# Patient Record
Sex: Female | Born: 1965
Health system: Southern US, Community
[De-identification: ages and names within clinical notes are randomized; demographics above are authoritative.]

---

## 1999-05-19 ENCOUNTER — Ambulatory Visit (HOSPITAL_COMMUNITY): Admission: RE | Admit: 1999-05-19 | Discharge: 1999-05-19 | Payer: Self-pay | Admitting: Gastroenterology

## 2000-04-12 ENCOUNTER — Ambulatory Visit (HOSPITAL_COMMUNITY): Admission: RE | Admit: 2000-04-12 | Discharge: 2000-04-12 | Payer: Self-pay | Admitting: Gastroenterology

## 2001-06-06 ENCOUNTER — Other Ambulatory Visit: Admission: RE | Admit: 2001-06-06 | Discharge: 2001-06-06 | Payer: Self-pay | Admitting: Obstetrics & Gynecology

## 2001-08-01 ENCOUNTER — Ambulatory Visit (HOSPITAL_COMMUNITY): Admission: RE | Admit: 2001-08-01 | Discharge: 2001-08-01 | Payer: Self-pay | Admitting: Obstetrics & Gynecology

## 2001-08-01 ENCOUNTER — Encounter: Payer: Self-pay | Admitting: Obstetrics & Gynecology

## 2001-12-27 ENCOUNTER — Inpatient Hospital Stay (HOSPITAL_COMMUNITY): Admission: AD | Admit: 2001-12-27 | Discharge: 2001-12-29 | Payer: Self-pay | Admitting: Obstetrics & Gynecology

## 2005-05-11 ENCOUNTER — Ambulatory Visit: Payer: Self-pay

## 2007-01-03 ENCOUNTER — Ambulatory Visit: Payer: Self-pay | Admitting: Family Medicine

## 2007-10-24 ENCOUNTER — Ambulatory Visit: Payer: Self-pay | Admitting: Internal Medicine

## 2009-03-18 ENCOUNTER — Ambulatory Visit: Payer: Self-pay | Admitting: Internal Medicine

## 2009-04-29 ENCOUNTER — Ambulatory Visit: Payer: Self-pay | Admitting: Internal Medicine

## 2010-01-07 ENCOUNTER — Ambulatory Visit: Payer: Self-pay | Admitting: Internal Medicine

## 2010-08-12 ENCOUNTER — Ambulatory Visit: Payer: Self-pay | Admitting: Internal Medicine

## 2010-09-11 ENCOUNTER — Ambulatory Visit: Payer: Self-pay | Admitting: Internal Medicine

## 2010-09-13 ENCOUNTER — Ambulatory Visit: Payer: Self-pay | Admitting: Internal Medicine

## 2010-09-21 ENCOUNTER — Ambulatory Visit: Payer: Self-pay | Admitting: Internal Medicine

## 2010-10-12 ENCOUNTER — Ambulatory Visit: Payer: Self-pay | Admitting: Internal Medicine

## 2013-09-25 ENCOUNTER — Ambulatory Visit: Payer: Self-pay

## 2016-11-28 DIAGNOSIS — L659 Nonscarring hair loss, unspecified: Secondary | ICD-10-CM | POA: Diagnosis not present

## 2016-11-28 DIAGNOSIS — L710 Perioral dermatitis: Secondary | ICD-10-CM | POA: Diagnosis not present

## 2016-11-28 DIAGNOSIS — L638 Other alopecia areata: Secondary | ICD-10-CM | POA: Diagnosis not present

## 2016-12-14 DIAGNOSIS — L710 Perioral dermatitis: Secondary | ICD-10-CM | POA: Diagnosis not present

## 2017-04-11 ENCOUNTER — Other Ambulatory Visit: Payer: Self-pay | Admitting: Nurse Practitioner

## 2017-04-11 DIAGNOSIS — Z1231 Encounter for screening mammogram for malignant neoplasm of breast: Secondary | ICD-10-CM

## 2017-08-22 DIAGNOSIS — L718 Other rosacea: Secondary | ICD-10-CM | POA: Diagnosis not present

## 2017-09-14 DIAGNOSIS — L718 Other rosacea: Secondary | ICD-10-CM | POA: Diagnosis not present

## 2017-11-14 DIAGNOSIS — N390 Urinary tract infection, site not specified: Secondary | ICD-10-CM | POA: Diagnosis not present

## 2017-11-15 DIAGNOSIS — E782 Mixed hyperlipidemia: Secondary | ICD-10-CM | POA: Diagnosis not present

## 2017-11-15 DIAGNOSIS — Z0001 Encounter for general adult medical examination with abnormal findings: Secondary | ICD-10-CM | POA: Diagnosis not present

## 2017-11-23 ENCOUNTER — Other Ambulatory Visit: Payer: Self-pay

## 2017-11-23 MED ORDER — ROSUVASTATIN CALCIUM 10 MG PO TABS: 10.0000 mg | ORAL_TABLET | Freq: Every day | ORAL | 3 refills | Status: DC

## 2017-12-19 ENCOUNTER — Other Ambulatory Visit: Payer: Self-pay | Admitting: Internal Medicine

## 2017-12-20 ENCOUNTER — Other Ambulatory Visit: Payer: Self-pay

## 2017-12-20 ENCOUNTER — Telehealth: Payer: Self-pay | Admitting: Cardiovascular Disease

## 2017-12-20 DIAGNOSIS — Z136 Encounter for screening for cardiovascular disorders: Secondary | ICD-10-CM

## 2017-12-20 MED ORDER — ESTRADIOL 0.025 MG/24HR TD PTWK: 0.0250 mg | MEDICATED_PATCH | TRANSDERMAL | 1 refills | Status: DC

## 2017-12-20 MED ORDER — PROGESTERONE MICRONIZED 100 MG PO CAPS: 100.0000 mg | ORAL_CAPSULE | Freq: Every day | ORAL | 3 refills | Status: DC

## 2017-12-20 NOTE — Telephone Encounter (Signed)
Scheduled CT calcium score January 17, 9am. No answer, VM full on pt's cell number.  Called office number. Office is closed.  Will call again.

## 2017-12-20 NOTE — Telephone Encounter (Signed)
Pt understands the test is $150 payable at the time of appt. She is agreeable with January 17, 8:45am arrival. Provided address and phone number to [facility] [place].

## 2017-12-20 NOTE — Telephone Encounter (Signed)
Iran OuchArida, Muhammad A, MD  Shon BatonYow, Tikia Skilton H, RN        Can you please schedule Dr. Welton FlakesKhan for coronary calcium score ( cell # [phone]) . Thanks.    Left message for CT cardiac scoring to call the office.

## 2017-12-28 ENCOUNTER — Encounter: Payer: Self-pay | Admitting: Radiology

## 2017-12-28 ENCOUNTER — Ambulatory Visit (INDEPENDENT_AMBULATORY_CARE_PROVIDER_SITE_OTHER)
Admission: RE | Admit: 2017-12-28 | Discharge: 2017-12-28 | Disposition: A | Discharge status: Home / Self Care | Payer: Self-pay | Source: Ambulatory Visit | Attending: Cardiovascular Disease | Admitting: Cardiovascular Disease

## 2017-12-28 DIAGNOSIS — Z136 Encounter for screening for cardiovascular disorders: Secondary | ICD-10-CM

## 2018-02-05 ENCOUNTER — Other Ambulatory Visit: Payer: Self-pay | Admitting: Nurse Practitioner

## 2018-02-05 DIAGNOSIS — L819 Disorder of pigmentation, unspecified: Secondary | ICD-10-CM

## 2018-02-05 MED ORDER — TRETINOIN 0.025 % EX CREA: TOPICAL_CREAM | Freq: Every day | CUTANEOUS | 3 refills | Status: DC

## 2018-02-05 NOTE — Progress Notes (Signed)
tertinoin 0.025% cream to be applied at bedtime as needed. [facility]

## 2018-02-12 ENCOUNTER — Ambulatory Visit: Payer: BLUE CROSS/BLUE SHIELD

## 2018-03-14 ENCOUNTER — Other Ambulatory Visit: Payer: Self-pay | Admitting: Nurse Practitioner

## 2018-03-14 DIAGNOSIS — K649 Unspecified hemorrhoids: Secondary | ICD-10-CM

## 2018-03-14 MED ORDER — LIDOCAINE (ANORECTAL) 5 % EX CREA: TOPICAL_CREAM | CUTANEOUS | 3 refills | Status: DC

## 2018-03-14 NOTE — Progress Notes (Signed)
Sent lidocaine (Anorectal) cream - apply 2 ot 3 times daily as needed.

## 2018-08-09 ENCOUNTER — Other Ambulatory Visit: Payer: Self-pay | Admitting: Nurse Practitioner

## 2018-08-09 MED ORDER — ESTRADIOL 0.025 MG/24HR TD PTTW: MEDICATED_PATCH | TRANSDERMAL | 1 refills | Status: DC

## 2018-08-15 ENCOUNTER — Other Ambulatory Visit: Payer: Self-pay | Admitting: Nurse Practitioner

## 2018-08-15 DIAGNOSIS — Z1239 Encounter for other screening for malignant neoplasm of breast: Secondary | ICD-10-CM

## 2018-08-15 NOTE — Progress Notes (Signed)
Order for screening mammogram placed today. Patient prefers [facility].

## 2018-09-05 ENCOUNTER — Ambulatory Visit
Admission: RE | Admit: 2018-09-05 | Discharge: 2018-09-05 | Disposition: A | Discharge status: Home / Self Care | Payer: BLUE CROSS/BLUE SHIELD | Source: Ambulatory Visit | Attending: Nurse Practitioner | Admitting: Nurse Practitioner

## 2018-09-05 DIAGNOSIS — Z1231 Encounter for screening mammogram for malignant neoplasm of breast: Secondary | ICD-10-CM | POA: Diagnosis not present

## 2018-09-05 DIAGNOSIS — Z1239 Encounter for other screening for malignant neoplasm of breast: Secondary | ICD-10-CM

## 2018-10-11 ENCOUNTER — Ambulatory Visit (INDEPENDENT_AMBULATORY_CARE_PROVIDER_SITE_OTHER): Payer: BLUE CROSS/BLUE SHIELD | Admitting: Internal Medicine

## 2018-10-11 ENCOUNTER — Encounter: Payer: Self-pay | Admitting: Internal Medicine

## 2018-10-11 DIAGNOSIS — N951 Menopausal and female climacteric states: Secondary | ICD-10-CM

## 2018-10-11 DIAGNOSIS — E785 Hyperlipidemia, unspecified: Secondary | ICD-10-CM | POA: Diagnosis not present

## 2018-10-11 DIAGNOSIS — F5102 Adjustment insomnia: Secondary | ICD-10-CM

## 2018-10-11 NOTE — Progress Notes (Signed)
Subjective:  Patient ID: Ashley Castillo, female    DOB: 1966-08-17  Age: 52 y.o. MRN: [id number]  CC: Diagnoses of Insomnia due to stress, Menopause syndrome, and Hyperlipidemia LDL goal <160 were pertinent to this visit.  HPI Ashley Castillo presents for establishment of care.  Ashley is generally healthy and has no acute issues.  Menopause in 2015.  Taking estrogen using a low dose patch oce a week. Using progesterone  50 mg compounded .  Has not had a period since menopause .   Cholesterol rose to 300 after menopause.  Statin intolerant due to severe myalgias.  Exercises regularly, had a coronary CT with Ashley Castillo,  Calcium score is zero.  Wondering about a newer statin for asians (starts with an "l)   Occasional insomnia managed with ambien prn  3 kids now all at home 2 college graduates and on 25 yr old.  Husband Ashley Castillo had cervical spine surgery at [place] did really well   Ashley Castillo.      History Ashley Castillo has no past medical history on file.   Ashley has no past surgical history on file.   Her family history includes Diabetes type I in her mother.Ashley Castillo. Ashley has never used smokeless tobacco. Ashley reports that Ashley does not drink alcohol or use drugs.  Outpatient Medications Prior to Visit  Medication Sig Dispense Refill  . estradiol (VIVELLE-DOT) 0.025 MG/24HR APPLY 1 PATCH TWICE A WEEK 24 patch 1  . Lidocaine, Anorectal, 5 % CREA Apply externally 2 to 3 times daily as needed 15 g 3  . rosuvastatin (CRESTOR) 10 MG tablet Take 1 tablet (10 mg total) by mouth daily. 90 tablet 3  . tretinoin (RETIN-A) 0.025 % cream Apply topically at bedtime. 45 g 3  . estradiol (CLIMARA - DOSED IN MG/24 HR) 0.025 mg/24hr patch Place 1 patch (0.025 mg total) onto the skin 2 (two) times a week. 1 patch twice weekly (Patient not taking: Reported on 10/11/2018) 24 patch 1  . progesterone (PROMETRIUM) 100 MG capsule Take 1 capsule (100 mg total) by mouth daily. (Patient not taking: Reported  on 10/11/2018) 90 capsule 3   No facility-administered medications prior to visit.     Review of Systems:  Patient denies headache, fevers, malaise, unintentional weight loss, skin rash, eye pain, sinus congestion and sinus pain, sore throat, dysphagia,  hemoptysis , cough, dyspnea, wheezing, chest pain, palpitations, orthopnea, edema, abdominal pain, nausea, melena, diarrhea, constipation, flank pain, dysuria, hematuria, urinary  Frequency, nocturia, numbness, tingling, seizures,  Focal weakness, Loss of consciousness,  Tremor, insomnia, depression, anxiety, and suicidal ideation.     Objective:  BP 106/80 (BP Location: Right Arm, Patient Position: Sitting, Cuff Size: Normal)   Pulse 73   Temp 97.9 F (36.6 C) (Oral)   Resp 15   Ht 5' 3.5" (1.613 m)   Wt 147 lb 12.8 oz (67 kg)   SpO2 98%   BMI 25.77 kg/m   Physical Exam:  General appearance: alert, cooperative and appears stated age Head: Normocephalic, without obvious abnormality, atraumatic Eyes: conjunctivae/corneas clear. PERRL, EOM's intact. Fundi benign. Ears: normal TM's and external ear canals both ears Nose: Nares normal. Septum midline. Mucosa normal. No drainage or sinus tenderness. Throat: lips, mucosa, and tongue normal; teeth and gums normal Neck: no adenopathy, no carotid bruit, no JVD, supple, symmetrical, trachea midline and thyroid not enlarged, symmetric, no tenderness/mass/nodules Lungs: clear to auscultation bilaterally Breasts: normal appearance, no masses or tenderness Heart: regular rate  and rhythm, S1, S2 normal, no murmur, click, rub or gallop Abdomen: soft, non-tender; bowel sounds normal; no masses,  no organomegaly Extremities: extremities normal, atraumatic, no cyanosis or edema Pulses: 2+ and symmetric Skin: Skin color, texture, turgor normal. No rashes or lesions Neurologic: Alert and oriented X 3, normal strength and tone. Normal symmetric reflexes. Normal coordination and gait.       Assessment & Plan:   Problem List Items Addressed This Visit    Hyperlipidemia LDL goal <160    Untreated due to statin intolerance.  Cardiac CT was done recently and her score was zero.  Ashley remains concerned about her risk and would consider another statin trial       Insomnia due to stress    Managed with prn ambien.  The risks and benefits of  Hypnotic  use were discussed with patient today including excessive sedation leading to respiratory depression,  impaired thinking/driving, and addiction.  Patient was advised to avoid concurrent use with alcohol, to use medication only as needed and not to share with others  .       Menopause syndrome    Managed with HRT.  Ashley is taking estrogen transdermal and progesterone       A total of 45 minutes was spent with patient more than half of which was spent in counseling patient on the above mentioned issues , reviewing and explaining recent labs and imaging studies done, and coordination of care.  I have discontinued Ashley Castillo's progesterone. I am also having her maintain her rosuvastatin, tretinoin, Lidocaine (Anorectal), and estradiol.  No orders of the defined types were placed in this encounter.   Medications Discontinued During This Encounter  Medication Reason  . progesterone (PROMETRIUM) 100 MG capsule Patient has not taken in last 30 days  . estradiol (CLIMARA - DOSED IN MG/24 HR) 0.025 mg/24hr patch     Follow-up: Return in about 3 months (around 01/11/2019) for CPE with PAP .   Ashley Shams, MD

## 2018-10-11 NOTE — Patient Instructions (Signed)
Glad to see you!  Phone [phone]

## 2018-10-13 DIAGNOSIS — F5102 Adjustment insomnia: Secondary | ICD-10-CM | POA: Insufficient documentation

## 2018-10-13 DIAGNOSIS — N951 Menopausal and female climacteric states: Secondary | ICD-10-CM | POA: Insufficient documentation

## 2018-10-13 DIAGNOSIS — E785 Hyperlipidemia, unspecified: Secondary | ICD-10-CM | POA: Insufficient documentation

## 2018-10-13 NOTE — Assessment & Plan Note (Signed)
Untreated due to statin intolerance.  Cardiac CT was done recently and her score was zero.  She remains concerned about her risk and would consider another statin trial

## 2018-10-13 NOTE — Assessment & Plan Note (Signed)
Managed with prn ambien.  The risks and benefits of  Hypnotic  use were discussed with patient today including excessive sedation leading to respiratory depression,  impaired thinking/driving, and addiction.  Patient was advised to avoid concurrent use with alcohol, to use medication only as needed and not to share with others  .

## 2018-10-13 NOTE — Assessment & Plan Note (Signed)
Managed with HRT.  She is taking estrogen transdermal and progesterone

## 2018-10-26 ENCOUNTER — Other Ambulatory Visit: Payer: Self-pay | Admitting: Nurse Practitioner

## 2018-10-26 DIAGNOSIS — N952 Postmenopausal atrophic vaginitis: Secondary | ICD-10-CM

## 2018-10-26 MED ORDER — ESTRADIOL 10 MCG VA TABS: 1.0000 | ORAL_TABLET | VAGINAL | 11 refills | Status: DC

## 2018-10-26 NOTE — Progress Notes (Signed)
Sent in prescription for vagifem tablets. Use 1 tablet vaginally twice weekly. New prescription sent to her pharmacy

## 2018-12-07 ENCOUNTER — Telehealth: Payer: Self-pay

## 2018-12-07 NOTE — Telephone Encounter (Signed)
Called in [facility]xycycline [facility]

## 2019-01-04 ENCOUNTER — Other Ambulatory Visit: Payer: Self-pay | Admitting: Nurse Practitioner

## 2019-01-04 DIAGNOSIS — N951 Menopausal and female climacteric states: Secondary | ICD-10-CM

## 2019-01-04 MED ORDER — ESTRADIOL 0.0375 MG/24HR TD PTTW: 1.0000 | MEDICATED_PATCH | TRANSDERMAL | 12 refills | Status: DC

## 2019-01-04 NOTE — Progress Notes (Signed)
Increased dose of vivelle dot to 0.0375mg /24hours and sent [facility].

## 2019-01-09 DIAGNOSIS — H0289 Other specified disorders of eyelid: Secondary | ICD-10-CM | POA: Diagnosis not present

## 2019-01-09 DIAGNOSIS — H04123 Dry eye syndrome of bilateral lacrimal glands: Secondary | ICD-10-CM | POA: Diagnosis not present

## 2019-02-18 ENCOUNTER — Telehealth: Payer: Self-pay

## 2019-02-18 NOTE — Telephone Encounter (Signed)
Called in tamiflu 75 mg for 5 days error other note

## 2019-02-18 NOTE — Telephone Encounter (Signed)
Called in tamiflu to [facility] for 10 days as per [employer]

## 2019-02-25 ENCOUNTER — Other Ambulatory Visit: Payer: Self-pay

## 2019-02-26 DIAGNOSIS — L638 Other alopecia areata: Secondary | ICD-10-CM | POA: Diagnosis not present

## 2019-02-27 ENCOUNTER — Other Ambulatory Visit: Payer: Self-pay | Admitting: Nurse Practitioner

## 2019-02-27 DIAGNOSIS — H1013 Acute atopic conjunctivitis, bilateral: Secondary | ICD-10-CM

## 2019-02-27 MED ORDER — KETOROLAC TROMETHAMINE 0.5 % OP SOLN: 1.0000 [drp] | Freq: Four times a day (QID) | OPHTHALMIC | 3 refills | Status: DC

## 2019-02-27 NOTE — Progress Notes (Signed)
acular eye drops for allergic conjunctivitis. Use 1 drop in both eyes QID prn sent to [facility].

## 2019-03-14 ENCOUNTER — Other Ambulatory Visit: Payer: Self-pay

## 2019-03-14 ENCOUNTER — Ambulatory Visit: Payer: BLUE CROSS/BLUE SHIELD | Admitting: Internal Medicine

## 2019-03-18 ENCOUNTER — Telehealth: Payer: BLUE CROSS/BLUE SHIELD | Admitting: Nurse Practitioner

## 2019-03-18 ENCOUNTER — Other Ambulatory Visit: Payer: Self-pay

## 2019-03-18 MED ORDER — ESTRADIOL 0.0375 MG/24HR TD PTTW: 1.0000 | MEDICATED_PATCH | TRANSDERMAL | 3 refills | Status: DC

## 2019-03-18 NOTE — Progress Notes (Deleted)
Done

## 2019-04-11 ENCOUNTER — Ambulatory Visit: Payer: BLUE CROSS/BLUE SHIELD | Admitting: Internal Medicine

## 2019-04-24 ENCOUNTER — Other Ambulatory Visit: Payer: Self-pay | Admitting: Nurse Practitioner

## 2019-04-24 ENCOUNTER — Other Ambulatory Visit: Payer: Self-pay

## 2019-04-24 DIAGNOSIS — L719 Rosacea, unspecified: Secondary | ICD-10-CM

## 2019-04-24 MED ORDER — IVERMECTIN 1 % EX CREA: 1.0000 "application " | TOPICAL_CREAM | Freq: Every day | CUTANEOUS | 5 refills | Status: DC

## 2019-04-24 MED ORDER — ESOMEPRAZOLE MAGNESIUM 40 MG PO CPDR: 40.0000 mg | DELAYED_RELEASE_CAPSULE | Freq: Every day | ORAL | 3 refills | Status: DC

## 2019-04-24 MED ORDER — PLECANATIDE 3 MG PO TABS: 1.0000 | ORAL_TABLET | Freq: Every day | ORAL | 3 refills | Status: DC

## 2019-04-24 NOTE — Progress Notes (Signed)
New prescription for soolantra sent to [facility].

## 2019-04-29 ENCOUNTER — Other Ambulatory Visit: Payer: Self-pay

## 2019-04-29 MED ORDER — ESTRADIOL 0.025 MG/24HR TD PTTW: 1.0000 | MEDICATED_PATCH | TRANSDERMAL | 3 refills | Status: DC

## 2019-04-30 ENCOUNTER — Ambulatory Visit: Payer: BLUE CROSS/BLUE SHIELD | Admitting: Adult Health

## 2019-04-30 DIAGNOSIS — Z9229 Personal history of other drug therapy: Secondary | ICD-10-CM

## 2019-05-13 NOTE — Progress Notes (Signed)
[facility] [address] [place], [place] [id number]  Internal MEDICINE  Telephone Visit  Patient Name: Ashley Castillo  [id number]  [id number]  Date of Service: 05/13/2019  I connected with the patient at 8:00am by telephone and verified the patients identity using two identifiers.   I discussed the limitations, risks, security and privacy concerns of performing an evaluation and management service by telephone and the availability of in person appointments. I also discussed with the patient that there may be a patient responsible charge related to the service.  The patient expressed understanding and agrees to proceed.    No chief complaint on file.   HPI     Current Medication: Outpatient Encounter Medications as of 03/18/2019  Medication Sig  . Estradiol 10 MCG TABS vaginal tablet Place 1 tablet (10 mcg total) vaginally 2 (two) times a week.  Marland Kitchen. ketorolac (ACULAR) 0.5 % ophthalmic solution Place 1 drop into both eyes 4 (four) times daily.  . Lidocaine, Anorectal, 5 % CREA Apply externally 2 to 3 times daily as needed  . rosuvastatin (CRESTOR) 10 MG tablet Take 1 tablet (10 mg total) by mouth daily.  Marland Kitchen. tretinoin (RETIN-A) 0.025 % cream Apply topically at bedtime.  . [DISCONTINUED] estradiol (VIVELLE-DOT) 0.0375 MG/24HR Place 1 patch onto the skin 2 (two) times a week.  . [DISCONTINUED] estradiol (VIVELLE-DOT) 0.0375 MG/24HR Place 1 patch onto the skin 2 (two) times a week.   No facility-administered encounter medications on file as of 03/18/2019.     Surgical History: No past surgical history on file.  Medical History: No past medical history on file.  Family History: Family History  Problem Relation Age of Onset  . Diabetes type I Mother     Social History   Socioeconomic History  . Marital status: Married    Spouse name: Not on file  . Number of children: Not on file  . Years of education: Not on file  . Highest education level: Not on file  Occupational  History  . Not on file  Social Needs  . Financial resource strain: Not on file  . Food insecurity:    Worry: Not on file    Inability: Not on file  . Transportation needs:    Medical: Not on file    Non-medical: Not on file  Tobacco Use  . Smoking status: Never Smoker  . Smokeless tobacco: Never Used  Substance and Sexual Activity  . Alcohol use: Never    Frequency: Never  . Drug use: Never  . Sexual activity: Yes    Birth control/protection: Post-menopausal  Lifestyle  . Physical activity:    Days per week: Not on file    Minutes per session: Not on file  . Stress: Not on file  Relationships  . Social connections:    Talks on phone: Not on file    Gets together: Not on file    Attends religious service: Not on file    Active member of club or organization: Not on file    Attends meetings of clubs or organizations: Not on file    Relationship status: Not on file  . Intimate partner violence:    Fear of current or ex partner: Not on file    Emotionally abused: Not on file    Physically abused: Not on file    Forced sexual activity: Not on file  Other Topics Concern  . Not on file  Social History Narrative  . Not on file  Review of Systems  Vital Signs: There were no vitals taken for this visit.   Observation/Objective:     Assessment/Plan:   General Counseling: Ashley Castillo verbalizes understanding of the findings of today's phone visit and agrees with plan of treatment. I have discussed any further diagnostic evaluation that may be needed or ordered today. We also reviewed her medications today. she has been encouraged to call the office with any questions or concerns that should arise related to todays visit.    No orders of the defined types were placed in this encounter.   Meds ordered this encounter  Medications  . DISCONTD: estradiol (VIVELLE-DOT) 0.0375 MG/24HR    Sig: Place 1 patch onto the skin 2 (two) times a week.    Dispense:  24 patch     Refill:  3    Time spent: 5 Minutes    Dr Lyndon Code Internal medicine

## 2019-05-30 ENCOUNTER — Other Ambulatory Visit: Payer: Self-pay

## 2019-05-30 MED ORDER — DICLOFENAC SODIUM 1 % TD GEL: TRANSDERMAL | 2 refills | Status: AC

## 2019-06-10 ENCOUNTER — Other Ambulatory Visit: Payer: Self-pay

## 2019-06-10 MED ORDER — HYDROCORTISONE 0.5 % EX CREA: 1.0000 "application " | TOPICAL_CREAM | Freq: Two times a day (BID) | CUTANEOUS | 1 refills | Status: DC

## 2019-07-10 ENCOUNTER — Other Ambulatory Visit: Payer: Self-pay

## 2019-07-10 ENCOUNTER — Other Ambulatory Visit: Payer: Self-pay | Admitting: Nurse Practitioner

## 2019-07-10 DIAGNOSIS — N390 Urinary tract infection, site not specified: Secondary | ICD-10-CM

## 2019-07-10 DIAGNOSIS — N952 Postmenopausal atrophic vaginitis: Secondary | ICD-10-CM

## 2019-07-10 MED ORDER — LEVOFLOXACIN 500 MG PO TABS: 500.0000 mg | ORAL_TABLET | Freq: Every day | ORAL | 0 refills | Status: DC

## 2019-07-10 MED ORDER — ESTRADIOL 10 MCG VA TABS: 1.0000 | ORAL_TABLET | VAGINAL | 2 refills | Status: DC

## 2019-07-10 NOTE — Progress Notes (Signed)
Levofloxacin 500mg  daily for 7 days for UTI sent ot [facility].

## 2019-08-05 NOTE — Progress Notes (Signed)
Patient treated with Botox Cosmetic/ OnabotulinumtoxinA  Risks and Benefits explained to patient, Written consent obtained and signed.  Touch up per protocol   Aftercare instructions given to patient.  Patient denied any questions.  Will follow up in 14 days for results review.

## 2019-08-14 ENCOUNTER — Other Ambulatory Visit: Payer: Self-pay | Admitting: Nurse Practitioner

## 2019-08-14 DIAGNOSIS — E559 Vitamin D deficiency, unspecified: Secondary | ICD-10-CM

## 2019-08-14 DIAGNOSIS — E785 Hyperlipidemia, unspecified: Secondary | ICD-10-CM

## 2019-08-14 DIAGNOSIS — Z129 Encounter for screening for malignant neoplasm, site unspecified: Secondary | ICD-10-CM

## 2019-08-14 DIAGNOSIS — R5383 Other fatigue: Secondary | ICD-10-CM

## 2019-08-14 NOTE — Progress Notes (Signed)
Routine, fasting labs, along with anemia panel and Ca125 ordered with orders sent to [facility].

## 2019-08-15 ENCOUNTER — Other Ambulatory Visit: Payer: Self-pay | Admitting: Nurse Practitioner

## 2019-08-15 DIAGNOSIS — N951 Menopausal and female climacteric states: Secondary | ICD-10-CM

## 2019-08-15 NOTE — Progress Notes (Signed)
Add prolactin level to routine labs

## 2019-08-28 ENCOUNTER — Other Ambulatory Visit: Payer: Self-pay

## 2019-08-28 DIAGNOSIS — M9902 Segmental and somatic dysfunction of thoracic region: Secondary | ICD-10-CM | POA: Diagnosis not present

## 2019-08-28 DIAGNOSIS — M9903 Segmental and somatic dysfunction of lumbar region: Secondary | ICD-10-CM | POA: Diagnosis not present

## 2019-08-28 DIAGNOSIS — Z20828 Contact with and (suspected) exposure to other viral communicable diseases: Secondary | ICD-10-CM

## 2019-08-28 DIAGNOSIS — M9901 Segmental and somatic dysfunction of cervical region: Secondary | ICD-10-CM | POA: Diagnosis not present

## 2019-08-28 DIAGNOSIS — M5386 Other specified dorsopathies, lumbar region: Secondary | ICD-10-CM | POA: Diagnosis not present

## 2019-09-09 DIAGNOSIS — R5383 Other fatigue: Secondary | ICD-10-CM | POA: Diagnosis not present

## 2019-09-09 DIAGNOSIS — E785 Hyperlipidemia, unspecified: Secondary | ICD-10-CM | POA: Diagnosis not present

## 2019-09-09 DIAGNOSIS — R971 Elevated cancer antigen 125 [CA 125]: Secondary | ICD-10-CM | POA: Diagnosis not present

## 2019-09-09 DIAGNOSIS — D510 Vitamin B12 deficiency anemia due to intrinsic factor deficiency: Secondary | ICD-10-CM | POA: Diagnosis not present

## 2019-09-09 DIAGNOSIS — Z129 Encounter for screening for malignant neoplasm, site unspecified: Secondary | ICD-10-CM | POA: Diagnosis not present

## 2019-09-09 DIAGNOSIS — E559 Vitamin D deficiency, unspecified: Secondary | ICD-10-CM | POA: Diagnosis not present

## 2019-09-10 ENCOUNTER — Other Ambulatory Visit: Payer: Self-pay

## 2019-09-10 DIAGNOSIS — Z20828 Contact with and (suspected) exposure to other viral communicable diseases: Secondary | ICD-10-CM

## 2019-09-10 NOTE — Progress Notes (Signed)
A few results are outstanding, but so far, things look good. Just lipid panel a little high. Are you still on crestor?

## 2019-09-13 LAB — SAR COV2 SEROLOGY (COVID19)AB(IGG),IA

## 2019-09-13 LAB — CA 125: Cancer Antigen (CA) 125: 11.7 U/mL (ref 0.0–38.1)

## 2019-09-13 LAB — CBC WITH DIFFERENTIAL/PLATELET
Basophils Absolute: 0.1 10*3/uL (ref 0.0–0.2)
Basos: 1 %
EOS (ABSOLUTE): 0.4 10*3/uL (ref 0.0–0.4)
Eos: 6 %
Hematocrit: 45 % (ref 34.0–46.6)
Hemoglobin: 15.3 g/dL (ref 11.1–15.9)
Immature Grans (Abs): 0 10*3/uL (ref 0.0–0.1)
Immature Granulocytes: 0 %
Lymphocytes Absolute: 3.3 10*3/uL — ABNORMAL HIGH (ref 0.7–3.1)
Lymphs: 45 %
MCH: 29.2 pg (ref 26.6–33.0)
MCHC: 34 g/dL (ref 31.5–35.7)
MCV: 86 fL (ref 79–97)
Monocytes Absolute: 0.4 10*3/uL (ref 0.1–0.9)
Monocytes: 5 %
Neutrophils Absolute: 3.2 10*3/uL (ref 1.4–7.0)
Neutrophils: 43 %
Platelets: 255 10*3/uL (ref 150–450)
RBC: 5.24 x10E6/uL (ref 3.77–5.28)
RDW: 12 % (ref 11.7–15.4)
WBC: 7.3 10*3/uL (ref 3.4–10.8)

## 2019-09-13 LAB — LIPID PANEL
Chol/HDL Ratio: 5.7 ratio — ABNORMAL HIGH (ref 0.0–4.4)
Cholesterol, Total: 240 mg/dL — ABNORMAL HIGH (ref 100–199)
HDL: 42 mg/dL (ref >39)
LDL Chol Calc (NIH): 167 mg/dL — ABNORMAL HIGH (ref 0–99)
Triglycerides: 170 mg/dL — ABNORMAL HIGH (ref 0–149)
VLDL Cholesterol Cal: 31 mg/dL (ref 5–40)

## 2019-09-13 LAB — B12 AND FOLATE PANEL
Folate: 7.7 ng/mL (ref >3.0)
Vitamin B-12: 420 pg/mL (ref 232–1245)

## 2019-09-13 LAB — COMPREHENSIVE METABOLIC PANEL
ALT: 16 IU/L (ref 0–32)
AST: 16 IU/L (ref 0–40)
Albumin/Globulin Ratio: 1.7 (ref 1.2–2.2)
Albumin: 4.3 g/dL (ref 3.8–4.9)
Alkaline Phosphatase: 54 IU/L (ref 39–117)
BUN/Creatinine Ratio: 22 (ref 9–23)
BUN: 17 mg/dL (ref 6–24)
Bilirubin Total: 0.4 mg/dL (ref 0.0–1.2)
CO2: 21 mmol/L (ref 20–29)
Calcium: 9.2 mg/dL (ref 8.7–10.2)
Chloride: 102 mmol/L (ref 96–106)
Creatinine, Ser: 0.79 mg/dL (ref 0.57–1.00)
GFR calc Af Amer: 99 mL/min/{1.73_m2} (ref >59)
GFR calc non Af Amer: 86 mL/min/{1.73_m2} (ref >59)
Globulin, Total: 2.5 g/dL (ref 1.5–4.5)
Glucose: 94 mg/dL (ref 65–99)
Potassium: 4.4 mmol/L (ref 3.5–5.2)
Sodium: 137 mmol/L (ref 134–144)
Total Protein: 6.8 g/dL (ref 6.0–8.5)

## 2019-09-13 LAB — IRON,TIBC AND FERRITIN PANEL
Ferritin: 97 ng/mL (ref 15–150)
Iron Saturation: 28 % (ref 15–55)
Iron: 82 ug/dL (ref 27–159)
Total Iron Binding Capacity: 292 ug/dL (ref 250–450)
UIBC: 210 ug/dL (ref 131–425)

## 2019-09-13 LAB — PROLACTIN: Prolactin: 15.7 ng/mL (ref 4.8–23.3)

## 2019-09-13 LAB — TSH: TSH: 3.73 u[IU]/mL (ref 0.450–4.500)

## 2019-09-13 LAB — VITAMIN D 1,25 DIHYDROXY
Vitamin D 1, 25 (OH)2 Total: 38 pg/mL
Vitamin D2 1, 25 (OH)2: <10 pg/mL
Vitamin D3 1, 25 (OH)2: 33 pg/mL

## 2019-09-13 LAB — T4, FREE: Free T4: 1.14 ng/dL (ref 0.82–1.77)

## 2019-09-13 LAB — EUROIMMUN SARS-COV-2 AB, IGG: Euroimmun SARS-CoV-2 Ab, IgG: NEGATIVE

## 2019-09-16 NOTE — Progress Notes (Signed)
All look good. Lipid panel a little high.

## 2019-10-30 DIAGNOSIS — L639 Alopecia areata, unspecified: Secondary | ICD-10-CM | POA: Diagnosis not present

## 2019-11-06 IMAGING — MG MM DIGITAL SCREENING BILAT W/ TOMO W/ CAD
8 series · 9 of 24 positions shown · non-contrast
Comparison: Previous exam(s).

CLINICAL DATA: Screening.

EXAM:
DIGITAL SCREENING BILATERAL MAMMOGRAM WITH TOMO AND CAD

[L CC synth-2D]
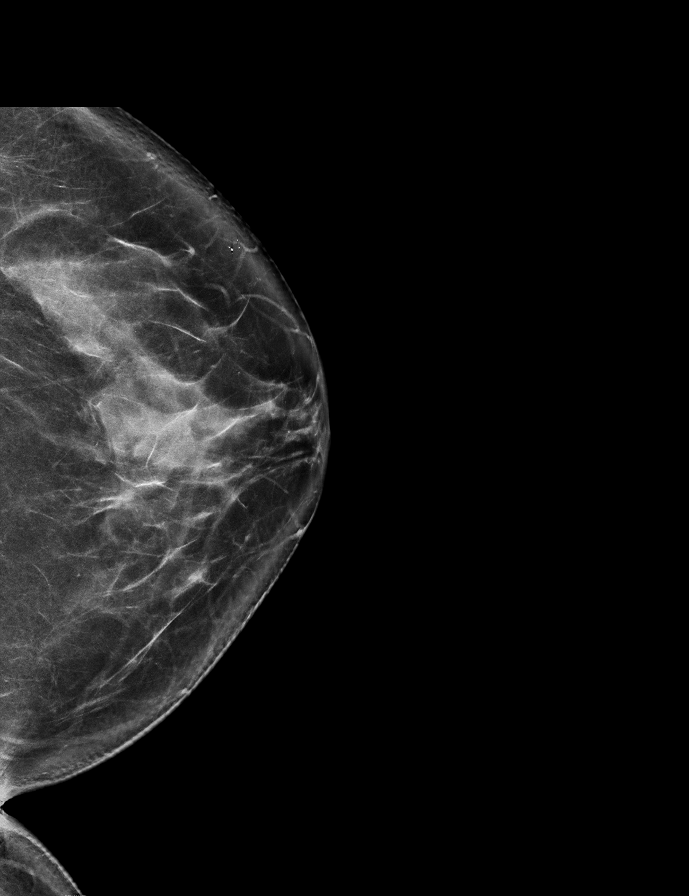

[R CC synth-2D]
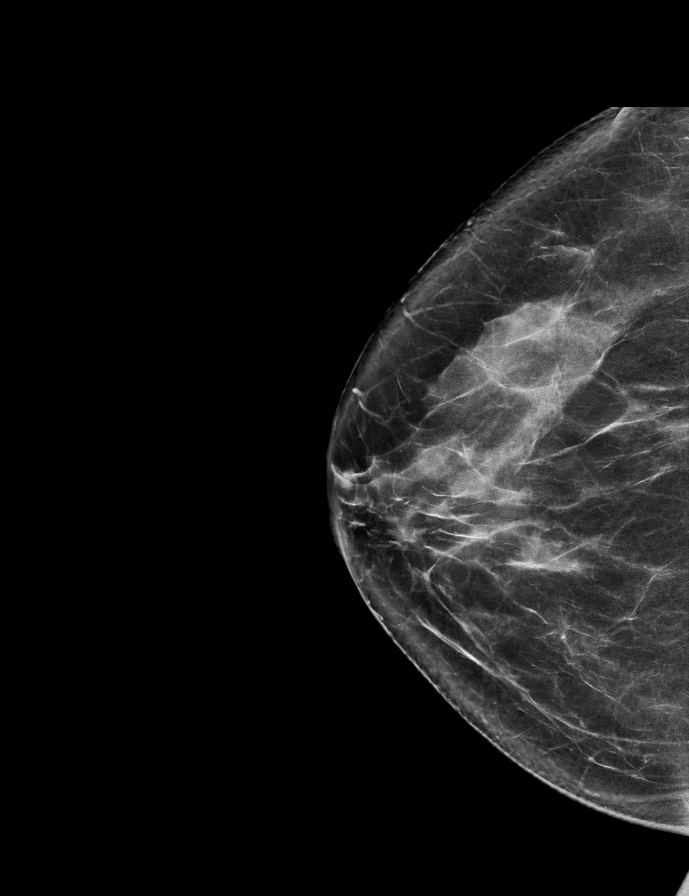

[R MLO synth-2D]
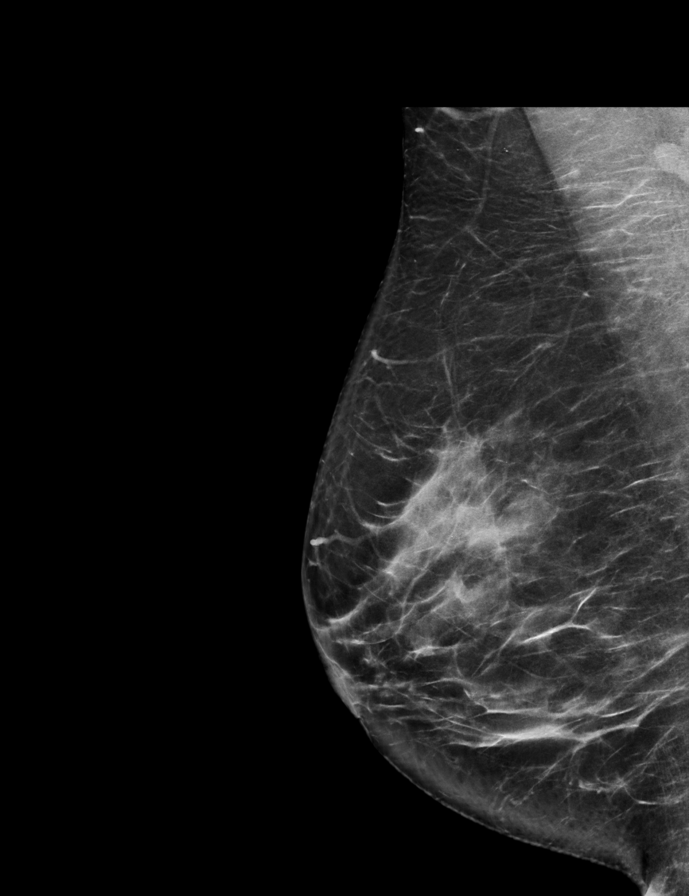

[L MLO synth-2D]
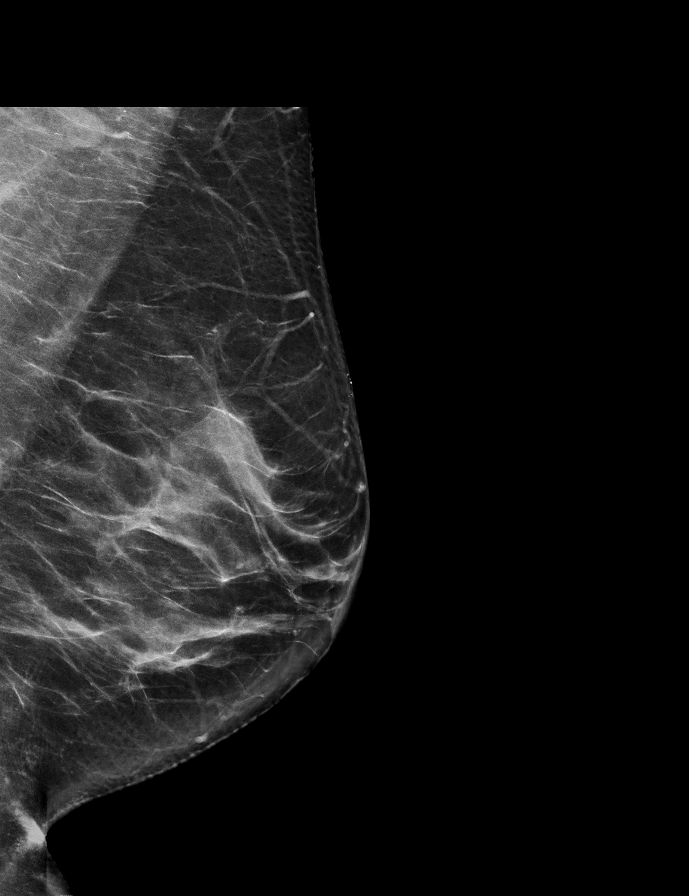

[R MLO tomo · 2 of 75 frames shown]
[frame 25/75]
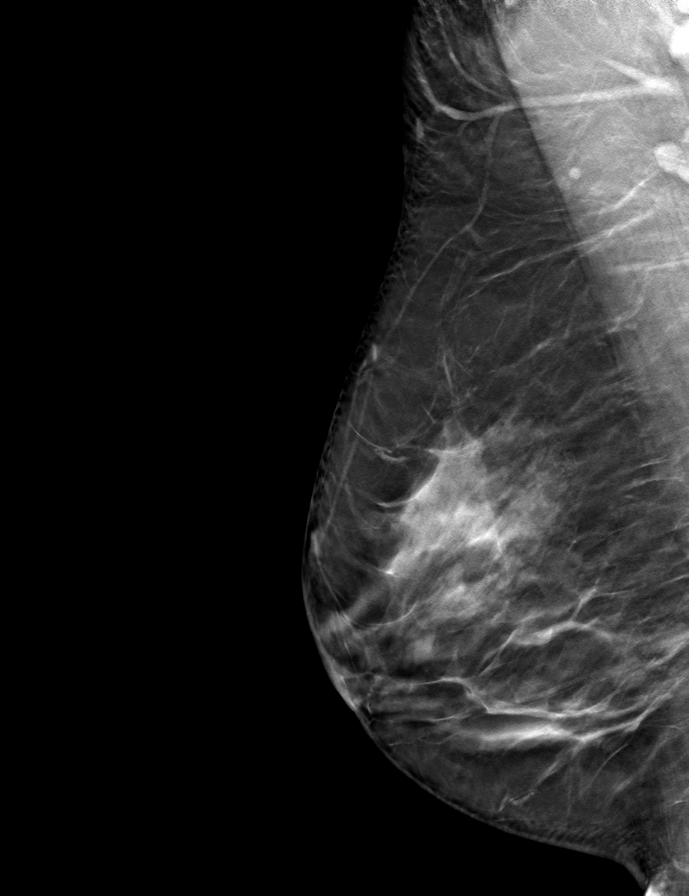
[frame 38/75]
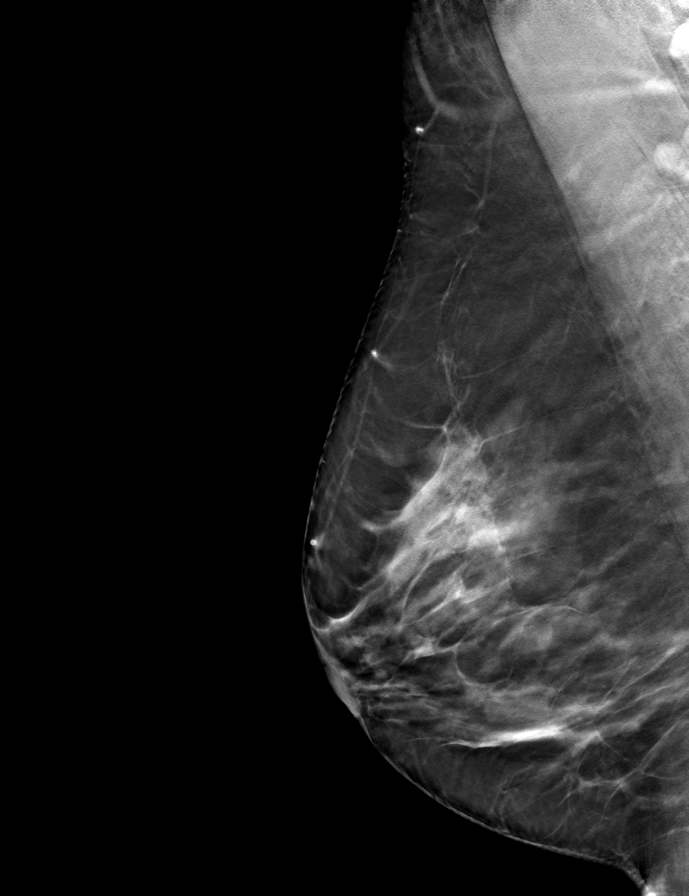

[R CC tomo · tomo slice 37/74.0]
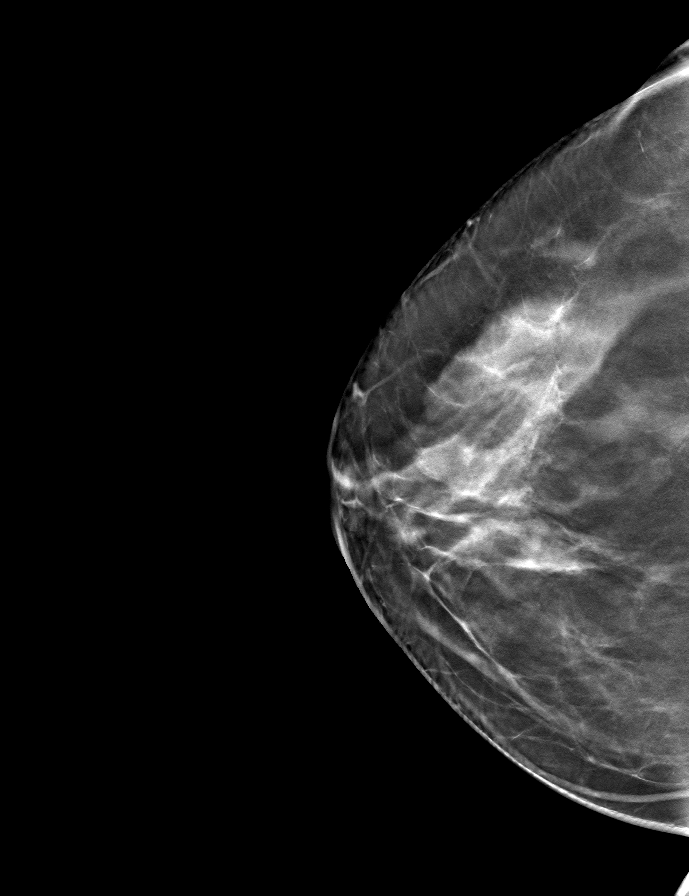

[L CC tomo · tomo slice 40/79.0]
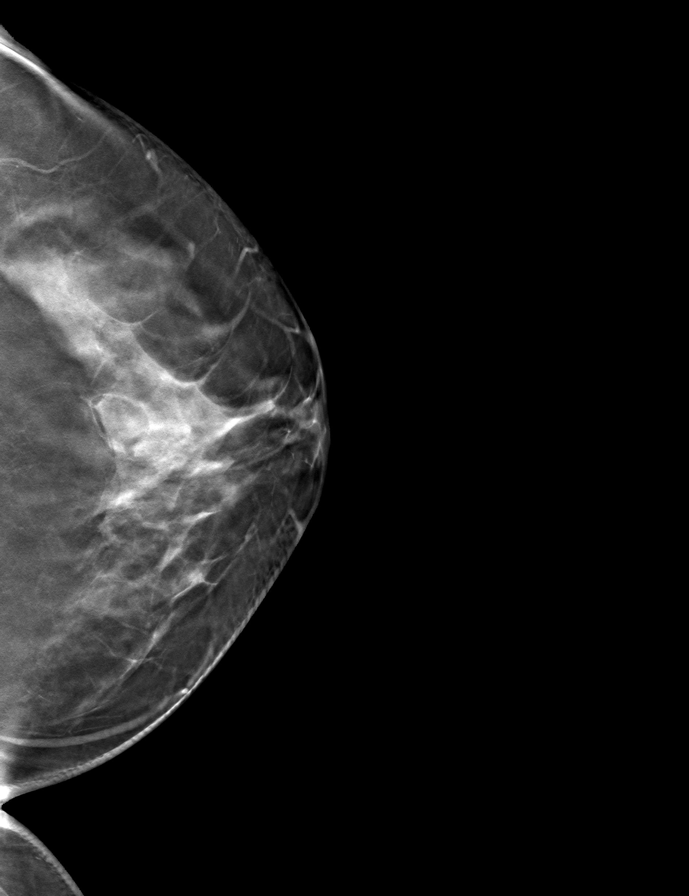

[L MLO tomo · tomo slice 39/76.0]
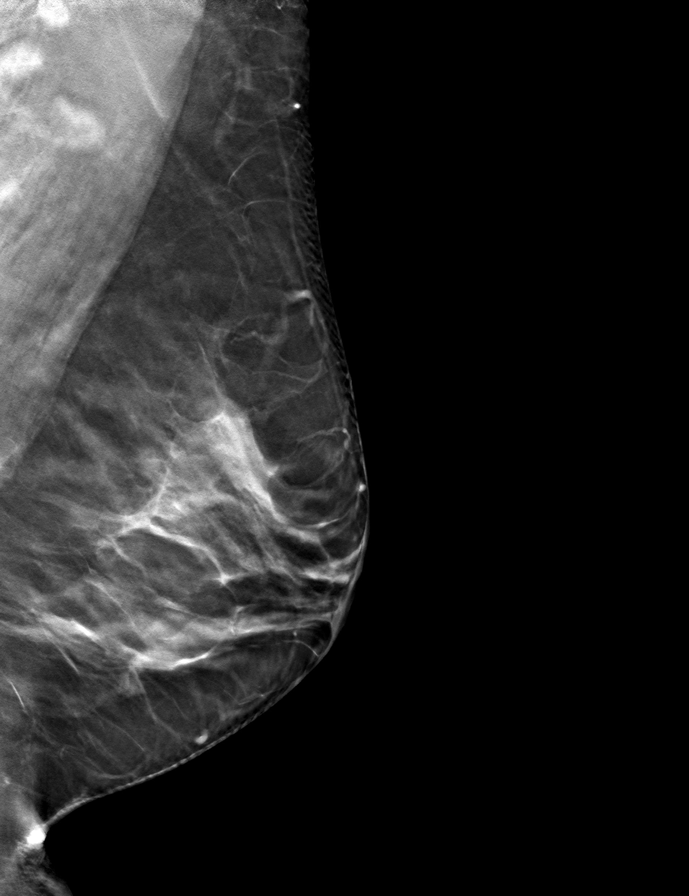

[9 of 24 positions shown; findings below may reference images not displayed]

ACR Breast Density Category c: The breast tissue is heterogeneously
dense, which may obscure small masses.
FINDINGS: There are no findings suspicious for malignancy. Images were
processed with CAD.
IMPRESSION: No mammographic evidence of malignancy. A result letter of this
screening mammogram will be mailed directly to the patient.

RECOMMENDATION:
Screening mammogram in one year. (Code:FT-U-LHB)

BI-RADS CATEGORY  1: Negative.

## 2019-11-28 ENCOUNTER — Other Ambulatory Visit: Payer: Self-pay

## 2019-11-28 ENCOUNTER — Ambulatory Visit
Admission: RE | Admit: 2019-11-28 | Discharge: 2019-11-28 | Disposition: A | Discharge status: Home / Self Care | Payer: BC Managed Care – PPO | Source: Ambulatory Visit | Attending: Nurse Practitioner | Admitting: Nurse Practitioner

## 2019-11-28 ENCOUNTER — Other Ambulatory Visit: Payer: Self-pay | Admitting: Nurse Practitioner

## 2019-11-28 ENCOUNTER — Other Ambulatory Visit
Admission: RE | Admit: 2019-11-28 | Discharge: 2019-11-28 | Disposition: A | Discharge status: Home / Self Care | Payer: BC Managed Care – PPO | Source: Ambulatory Visit

## 2019-11-28 DIAGNOSIS — R519 Headache, unspecified: Secondary | ICD-10-CM | POA: Diagnosis not present

## 2019-11-28 DIAGNOSIS — Z20828 Contact with and (suspected) exposure to other viral communicable diseases: Secondary | ICD-10-CM | POA: Insufficient documentation

## 2019-11-28 DIAGNOSIS — G8929 Other chronic pain: Secondary | ICD-10-CM | POA: Diagnosis not present

## 2019-11-28 DIAGNOSIS — R42 Dizziness and giddiness: Secondary | ICD-10-CM

## 2019-11-28 DIAGNOSIS — Z01812 Encounter for preprocedural laboratory examination: Secondary | ICD-10-CM

## 2019-11-28 DIAGNOSIS — Z20822 Contact with and (suspected) exposure to covid-19: Secondary | ICD-10-CM

## 2019-11-28 LAB — CBC WITH DIFFERENTIAL/PLATELET
Basophils Absolute: 0.1 10*3/uL (ref 0.0–0.2)
Basos: 1 %
EOS (ABSOLUTE): 0.3 10*3/uL (ref 0.0–0.4)
Eos: 4 %
Hematocrit: 44.3 % (ref 34.0–46.6)
Hemoglobin: 15.2 g/dL (ref 11.1–15.9)
Immature Grans (Abs): 0 10*3/uL (ref 0.0–0.1)
Immature Granulocytes: 0 %
Lymphocytes Absolute: 3.8 10*3/uL — ABNORMAL HIGH (ref 0.7–3.1)
Lymphs: 50 %
MCH: 29.9 pg (ref 26.6–33.0)
MCHC: 34.3 g/dL (ref 31.5–35.7)
MCV: 87 fL (ref 79–97)
Monocytes Absolute: 0.3 10*3/uL (ref 0.1–0.9)
Monocytes: 4 %
Neutrophils Absolute: 3.1 10*3/uL (ref 1.4–7.0)
Neutrophils: 41 %
Platelets: 257 10*3/uL (ref 150–450)
RBC: 5.08 x10E6/uL (ref 3.77–5.28)
RDW: 12.8 % (ref 11.7–15.4)
WBC: 7.6 10*3/uL (ref 3.4–10.8)

## 2019-11-28 LAB — BASIC METABOLIC PANEL
BUN/Creatinine Ratio: 18 (ref 9–23)
BUN: 12 mg/dL (ref 6–24)
CO2: 21 mmol/L (ref 20–29)
Calcium: 9.5 mg/dL (ref 8.7–10.2)
Chloride: 101 mmol/L (ref 96–106)
Creatinine, Ser: 0.65 mg/dL (ref 0.57–1.00)
GFR calc Af Amer: 117 mL/min/{1.73_m2} (ref >59)
GFR calc non Af Amer: 102 mL/min/{1.73_m2} (ref >59)
Glucose: 103 mg/dL — ABNORMAL HIGH (ref 65–99)
Potassium: 3.9 mmol/L (ref 3.5–5.2)
Sodium: 139 mmol/L (ref 134–144)

## 2019-11-28 LAB — SARS CORONAVIRUS 2 (TAT 6-24 HRS): SARS Coronavirus 2: NEGATIVE

## 2019-11-28 NOTE — Progress Notes (Signed)
Normal CT exam of head.

## 2019-11-28 NOTE — Progress Notes (Signed)
Overall, cbc and BMP look good

## 2019-11-28 NOTE — Progress Notes (Signed)
cbc

## 2019-11-29 NOTE — Progress Notes (Signed)
Patient notified

## 2019-12-20 ENCOUNTER — Other Ambulatory Visit: Payer: Self-pay | Admitting: Nurse Practitioner

## 2019-12-20 DIAGNOSIS — N951 Menopausal and female climacteric states: Secondary | ICD-10-CM

## 2019-12-20 MED ORDER — ESTRADIOL 0.025 MG/24HR TD PTTW: 1.0000 | MEDICATED_PATCH | TRANSDERMAL | 5 refills | Status: DC

## 2019-12-20 NOTE — Progress Notes (Signed)
Renewed vivelle dot 0.025mg /24 hours - apply one patch twice weekly. Sent [facility].

## 2020-01-15 DIAGNOSIS — H524 Presbyopia: Secondary | ICD-10-CM | POA: Diagnosis not present

## 2020-01-15 DIAGNOSIS — H04123 Dry eye syndrome of bilateral lacrimal glands: Secondary | ICD-10-CM | POA: Diagnosis not present

## 2020-02-20 ENCOUNTER — Other Ambulatory Visit: Payer: BC Managed Care – PPO

## 2020-02-24 ENCOUNTER — Other Ambulatory Visit: Payer: Self-pay

## 2020-02-25 ENCOUNTER — Other Ambulatory Visit: Payer: Self-pay

## 2020-02-25 ENCOUNTER — Ambulatory Visit: Payer: BC Managed Care – PPO | Attending: Internal Medicine

## 2020-02-25 DIAGNOSIS — Z20822 Contact with and (suspected) exposure to covid-19: Secondary | ICD-10-CM | POA: Insufficient documentation

## 2020-02-26 LAB — NOVEL CORONAVIRUS, NAA: SARS-CoV-2, NAA: NOT DETECTED

## 2020-04-10 ENCOUNTER — Other Ambulatory Visit: Payer: Self-pay

## 2020-04-10 DIAGNOSIS — N951 Menopausal and female climacteric states: Secondary | ICD-10-CM

## 2020-04-10 DIAGNOSIS — N952 Postmenopausal atrophic vaginitis: Secondary | ICD-10-CM

## 2020-04-10 MED ORDER — ESTRADIOL 0.025 MG/24HR TD PTTW: 1.0000 | MEDICATED_PATCH | TRANSDERMAL | 5 refills | Status: DC

## 2020-04-22 ENCOUNTER — Other Ambulatory Visit: Payer: Self-pay

## 2020-04-22 MED ORDER — TRULANCE 3 MG PO TABS: 1.0000 | ORAL_TABLET | Freq: Every day | ORAL | 3 refills | Status: DC

## 2020-05-01 DIAGNOSIS — K2970 Gastritis, unspecified, without bleeding: Secondary | ICD-10-CM | POA: Diagnosis not present

## 2020-05-01 DIAGNOSIS — Z1211 Encounter for screening for malignant neoplasm of colon: Secondary | ICD-10-CM | POA: Diagnosis not present

## 2020-05-01 DIAGNOSIS — K219 Gastro-esophageal reflux disease without esophagitis: Secondary | ICD-10-CM | POA: Diagnosis not present

## 2020-05-01 DIAGNOSIS — R1310 Dysphagia, unspecified: Secondary | ICD-10-CM | POA: Diagnosis not present

## 2020-05-01 DIAGNOSIS — Z800 Family history of malignant neoplasm of digestive organs: Secondary | ICD-10-CM | POA: Diagnosis not present

## 2020-09-01 ENCOUNTER — Ambulatory Visit (INDEPENDENT_AMBULATORY_CARE_PROVIDER_SITE_OTHER): Payer: BC Managed Care – PPO | Admitting: Dermatology

## 2020-09-01 ENCOUNTER — Other Ambulatory Visit: Payer: Self-pay

## 2020-09-01 DIAGNOSIS — L639 Alopecia areata, unspecified: Secondary | ICD-10-CM | POA: Diagnosis not present

## 2020-09-01 NOTE — Progress Notes (Signed)
   Follow-Up Visit   Subjective  Ashley Castillo is a 54 y.o. female who presents for the following: alopecia areata. Patient has a history of alopecia areata and comes in for ILK injections which typically resolves affected area. She would like ILK injections today.   The following portions of the chart were reviewed this encounter and updated as appropriate:  Tobacco  Allergies  Meds  Problems  Med Hx  Surg Hx  Fam Hx     Review of Systems:  No other skin or systemic complaints except as noted in HPI or Assessment and Plan.  Objective  Well appearing patient in no apparent distress; mood and affect are within normal limits.  A focused examination was performed including the scalp. Relevant physical exam findings are noted in the Assessment and Plan.  Objective  Scalp: Round, patchy areas of nonscarring hair loss. 1.5 cm.  Images     Assessment & Plan  Alopecia areata - 1 spot today. Reviewed trigger factors (stress).   Recommend thyroid evaluation (TSH) if not done in past year. Scalp  Kenalog 3.0mg /mL injected into aa of R scalp.  Total volume: 1.5 cc Number of injections: 9 Lot number: [id number] Expiration date: 11/2021   Return if symptoms worsen or fail to improve.  Ashley Castillo, CMA, am acting as scribe for Armida Sans, MD .  Documentation: I have reviewed the above documentation for accuracy and completeness, and I agree with the above.  Armida Sans, MD

## 2020-09-02 ENCOUNTER — Encounter: Payer: Self-pay | Admitting: Dermatology

## 2020-09-08 ENCOUNTER — Other Ambulatory Visit: Payer: Self-pay

## 2020-09-08 NOTE — Telephone Encounter (Signed)
Called in [facility] for progesterone 50 mg 90 with 1 refills

## 2020-09-21 ENCOUNTER — Telehealth: Payer: Self-pay

## 2020-09-21 DIAGNOSIS — Z1231 Encounter for screening mammogram for malignant neoplasm of breast: Secondary | ICD-10-CM

## 2020-09-21 NOTE — Telephone Encounter (Signed)
Pt would like a referral for a mammogram sent to [facility]

## 2020-09-21 NOTE — Telephone Encounter (Signed)
Mammogram has been ordered and pt is aware.  

## 2020-10-08 ENCOUNTER — Other Ambulatory Visit: Payer: Self-pay | Admitting: Nurse Practitioner

## 2020-10-08 DIAGNOSIS — R0989 Other specified symptoms and signs involving the circulatory and respiratory systems: Secondary | ICD-10-CM

## 2020-10-08 NOTE — Progress Notes (Signed)
Order placed for carotid dopplers due to bilateral carotid bruit and near syncopal episodes.

## 2020-10-22 ENCOUNTER — Ambulatory Visit (INDEPENDENT_AMBULATORY_CARE_PROVIDER_SITE_OTHER): Payer: BC Managed Care – PPO | Admitting: Dermatology

## 2020-10-22 ENCOUNTER — Other Ambulatory Visit: Payer: Self-pay

## 2020-10-22 DIAGNOSIS — L639 Alopecia areata, unspecified: Secondary | ICD-10-CM

## 2020-10-22 NOTE — Progress Notes (Signed)
   Follow-Up Visit   Subjective  Ashley Castillo is a 54 y.o. female who presents for the following: Follow-up (2 months f/u Alopecia on the R scalp, ILK in past helped ). Pt report her hair dressor recently noticed this new spot on her R scalp   The following portions of the chart were reviewed this encounter and updated as appropriate:  Tobacco  Allergies  Meds  Problems  Med Hx  Surg Hx  Fam Hx     Review of Systems:  No other skin or systemic complaints except as noted in HPI or Assessment and Plan.  Objective  Well appearing patient in no apparent distress; mood and affect are within normal limits.  A focused examination was performed including face,scalp. Relevant physical exam findings are noted in the Assessment and Plan.  Objective  R scalp ~5 cm to post helix: 2.0 cm Round, patchy areas of non scarring hair loss.    Assessment & Plan  Alopecia areata R scalp ~5 cm to post helix  Intralesional injection - R scalp ~5 cm to post helix Location: R scalp ~5 cm post helix   Informed Consent: Discussed risks (infection, pain, bleeding, bruising, thinning of the skin, loss of skin pigment, lack of resolution, and recurrence of lesion) and benefits of the procedure, as well as the alternatives. Informed consent was obtained. Preparation: The area was prepared a standard fashion.  Anesthesia:none   Procedure Details: An intralesional injection was performed with Kenalog 3.3 mg/cc. 1.5 cc in total were injected.  Total number of injections: 8  Plan: The patient was instructed on post-op care. Recommend OTC analgesia as needed for pain.   Return if symptoms worsen or fail to improve.  IAngelique Holm, CMA, am acting as scribe for Armida Sans, MD .  Documentation: I have reviewed the above documentation for accuracy and completeness, and I agree with the above.  Armida Sans, MD

## 2020-10-23 ENCOUNTER — Other Ambulatory Visit: Payer: Self-pay

## 2020-10-27 ENCOUNTER — Encounter: Payer: Self-pay | Admitting: Dermatology

## 2020-10-27 DIAGNOSIS — M25512 Pain in left shoulder: Secondary | ICD-10-CM | POA: Diagnosis not present

## 2020-10-27 DIAGNOSIS — M542 Cervicalgia: Secondary | ICD-10-CM | POA: Diagnosis not present

## 2020-10-28 ENCOUNTER — Other Ambulatory Visit: Payer: Self-pay

## 2020-10-30 ENCOUNTER — Ambulatory Visit (INDEPENDENT_AMBULATORY_CARE_PROVIDER_SITE_OTHER): Payer: BC Managed Care – PPO

## 2020-10-30 ENCOUNTER — Other Ambulatory Visit: Payer: Self-pay

## 2020-10-30 DIAGNOSIS — R0989 Other specified symptoms and signs involving the circulatory and respiratory systems: Secondary | ICD-10-CM

## 2020-11-03 ENCOUNTER — Ambulatory Visit: Payer: BC Managed Care – PPO | Admitting: Nurse Practitioner

## 2020-11-11 ENCOUNTER — Other Ambulatory Visit: Payer: Self-pay

## 2020-11-13 ENCOUNTER — Encounter: Payer: BC Managed Care – PPO | Admitting: Internal Medicine

## 2020-11-13 NOTE — Procedures (Signed)
[facility] [address] [place], [place] [address]  DATE OF SERVICE: 10/30/2020  CAROTID DOPPLER INTERPRETATION:  Bilateral Carotid Ultrsasound and Color Doppler Examination was performed. The RIGHT CCA shows no plaque in the vessel. The LEFT CCA shows no plaque in the vessel. There was no intimal thickening noted in the RIGHT carotid artery. There was intimal thickening in the LEFT carotid artery.  The RIGHT CCA shows peak systolic velocity of 57 cm per second. The end diastolic velocity is 24 cm per second on the RIGHT side. The RIGHT ICA shows peak systolic velocity of 77 per second. RIGHT sided ICA end diastolic velocity is 36 cm per second. The RIGHT ECA shows a peak systolic velocity of 67 cm per second. The ICA/CCA ratio is calculated to be 1.35. This suggests no significant stenosis. The Vertebral Artery shows antegrade flow.  The LEFT CCA shows peak systolic velocity of 69 cm per second. The end diastolic velocity is 23 cm per second on the LEFT side. The LEFT ICA shows peak systolic velocity of 50 per second. LEFT sided ICA end diastolic velocity is 24 cm per second. The LEFT ECA shows a peak systolic velocity of 64 cm per second. The ICA/CCA ratio is calculated to be 0.72. This suggests no significant stenosis. The Vertebral Artery shows antegrade flow.   Impression:    The RIGHT CAROTID shows no significant stenosis. The LEFT CAROTID shows no significant stenosis.  There is no plaque formation noted on the LEFT and no plaque on the RIGHT  side. Consider a repeat Carotid doppler if clinical situation and symptoms warrant in 6-12 months. Patient should be encouraged to change lifestyles such as smoking cessation, regular exercise and dietary modification. Use of statins in the right clinical setting and ASA is encouraged.  Yevonne Pax, MD [facility]

## 2020-11-15 ENCOUNTER — Other Ambulatory Visit: Payer: Self-pay | Admitting: Internal Medicine

## 2020-11-15 NOTE — Progress Notes (Signed)
Test

## 2020-11-17 NOTE — Progress Notes (Signed)
Hcy. Carotids look good. No plaque or significant stenosis, bilaterally. :)

## 2020-11-18 ENCOUNTER — Other Ambulatory Visit: Payer: Self-pay

## 2020-12-14 ENCOUNTER — Other Ambulatory Visit: Payer: Self-pay | Admitting: Hospice and Palliative Medicine

## 2020-12-14 DIAGNOSIS — N951 Menopausal and female climacteric states: Secondary | ICD-10-CM

## 2020-12-14 MED ORDER — ESTRADIOL 0.025 MG/24HR TD PTTW: 1.0000 | MEDICATED_PATCH | TRANSDERMAL | 5 refills | Status: DC

## 2020-12-18 ENCOUNTER — Telehealth: Payer: Self-pay

## 2020-12-18 ENCOUNTER — Encounter: Payer: BC Managed Care – PPO | Admitting: Internal Medicine

## 2020-12-18 NOTE — Telephone Encounter (Signed)
Pt was calling to check on her referral for a mammogram. She states that she never heard from them to schedule. Please advise

## 2020-12-18 NOTE — Telephone Encounter (Signed)
I spoke with pt and gave pt the number to call and sch mammo. Thank you!

## 2020-12-22 ENCOUNTER — Other Ambulatory Visit: Payer: Self-pay | Admitting: Physician Assistant

## 2021-01-04 ENCOUNTER — Other Ambulatory Visit: Payer: Self-pay

## 2021-01-04 MED ORDER — NEOMYCIN-POLYMYXIN-HC 3.5-10000-1 OP SUSP: 3.0000 [drp] | Freq: Four times a day (QID) | OPHTHALMIC | 0 refills | Status: DC

## 2021-01-11 ENCOUNTER — Other Ambulatory Visit: Payer: Self-pay | Admitting: Hospice and Palliative Medicine

## 2021-01-11 DIAGNOSIS — M3501 Sicca syndrome with keratoconjunctivitis: Secondary | ICD-10-CM | POA: Diagnosis not present

## 2021-01-11 DIAGNOSIS — H0289 Other specified disorders of eyelid: Secondary | ICD-10-CM | POA: Diagnosis not present

## 2021-01-11 MED ORDER — DOXYCYCLINE HYCLATE 20 MG PO TABS: 20.0000 mg | ORAL_TABLET | Freq: Two times a day (BID) | ORAL | 0 refills | Status: DC

## 2021-01-13 ENCOUNTER — Other Ambulatory Visit: Payer: Self-pay

## 2021-01-13 ENCOUNTER — Ambulatory Visit (INDEPENDENT_AMBULATORY_CARE_PROVIDER_SITE_OTHER): Payer: BC Managed Care – PPO | Admitting: Internal Medicine

## 2021-01-13 ENCOUNTER — Encounter: Payer: Self-pay | Admitting: Internal Medicine

## 2021-01-13 ENCOUNTER — Telehealth: Payer: Self-pay | Admitting: Internal Medicine

## 2021-01-13 VITALS — BP 120/70 | HR 80 | Temp 98.1°F | Ht 63.5 in | Wt 150.6 lb

## 2021-01-13 DIAGNOSIS — Z1231 Encounter for screening mammogram for malignant neoplasm of breast: Secondary | ICD-10-CM

## 2021-01-13 DIAGNOSIS — N951 Menopausal and female climacteric states: Secondary | ICD-10-CM

## 2021-01-13 DIAGNOSIS — L659 Nonscarring hair loss, unspecified: Secondary | ICD-10-CM

## 2021-01-13 DIAGNOSIS — E782 Mixed hyperlipidemia: Secondary | ICD-10-CM

## 2021-01-13 DIAGNOSIS — H04129 Dry eye syndrome of unspecified lacrimal gland: Secondary | ICD-10-CM | POA: Diagnosis not present

## 2021-01-13 DIAGNOSIS — I83811 Varicose veins of right lower extremities with pain: Secondary | ICD-10-CM | POA: Diagnosis not present

## 2021-01-13 DIAGNOSIS — Z8041 Family history of malignant neoplasm of ovary: Secondary | ICD-10-CM

## 2021-01-13 DIAGNOSIS — E785 Hyperlipidemia, unspecified: Secondary | ICD-10-CM

## 2021-01-13 DIAGNOSIS — Z0000 Encounter for general adult medical examination without abnormal findings: Secondary | ICD-10-CM | POA: Diagnosis not present

## 2021-01-13 MED ORDER — ZOLPIDEM TARTRATE 5 MG PO TABS: 5.0000 mg | ORAL_TABLET | Freq: Every evening | ORAL | 0 refills | Status: DC | PRN

## 2021-01-13 NOTE — Telephone Encounter (Signed)
lft vm for pt to call ofc to sch US. Thanks 

## 2021-01-13 NOTE — Patient Instructions (Addendum)
Good to see you!   Mammogram and transvaginal US ordered  [facility]   Return for PAP smear at your leisure   Orders will be redone for [facility] draw
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
## 2021-01-14 ENCOUNTER — Encounter: Payer: Self-pay | Admitting: Internal Medicine

## 2021-01-14 DIAGNOSIS — I83811 Varicose veins of right lower extremities with pain: Secondary | ICD-10-CM | POA: Insufficient documentation

## 2021-01-14 DIAGNOSIS — Z0000 Encounter for general adult medical examination without abnormal findings: Secondary | ICD-10-CM | POA: Insufficient documentation

## 2021-01-14 NOTE — Assessment & Plan Note (Signed)
SHE HAS discontinued rosuvastatin after cardiac CT and carotid dopplers were reassuring of her very low risk

## 2021-01-14 NOTE — Assessment & Plan Note (Signed)
Colonoscopy report requested from Dr Man..  Mammogram ordered,   Sh e will return for PAP smear  age appropriate education and counseling updated, referrals for preventative services and immunizations addressed, dietary and smoking counseling addressed, most recent labs reviewed.  I have personally reviewed and have noted:  1) the patient's medical and social history 2) The pt's use of alcohol, tobacco, and illicit drugs 3) The patient's current medications and supplements 4) Functional ability including ADL's, fall risk, home safety risk, hearing and visual impairment 5) Diet and physical activities 6) Evidence for depression or mood disorder 7) The patient's height, weight, and BMI have been recorded in the chart  I have made referrals, and provided counseling and education based on review of the above

## 2021-01-14 NOTE — Assessment & Plan Note (Signed)
Managed with minimal use of HRT

## 2021-01-14 NOTE — Assessment & Plan Note (Signed)
She is requesting treatment.  Referral to [facility] in progress

## 2021-01-15 ENCOUNTER — Telehealth: Payer: Self-pay | Admitting: Internal Medicine

## 2021-01-15 NOTE — Telephone Encounter (Signed)
lft vm for pt to call [phone] option 3 and then 2 to sch US. 

## 2021-02-10 ENCOUNTER — Ambulatory Visit
Admission: RE | Admit: 2021-02-10 | Discharge: 2021-02-10 | Disposition: A | Discharge status: Home / Self Care | Payer: BC Managed Care – PPO | Source: Ambulatory Visit | Attending: Internal Medicine | Admitting: Internal Medicine

## 2021-02-10 ENCOUNTER — Other Ambulatory Visit: Payer: Self-pay

## 2021-02-10 DIAGNOSIS — Z1231 Encounter for screening mammogram for malignant neoplasm of breast: Secondary | ICD-10-CM | POA: Diagnosis not present

## 2021-02-16 ENCOUNTER — Telehealth: Payer: Self-pay | Admitting: Internal Medicine

## 2021-02-16 NOTE — Telephone Encounter (Signed)
Castillo, Ashley R 01/13/2021 4:09 PM lft vm for pt to call ofc to sch [place].  Castillo, Ashley R 01/15/2021 10:05 AM lft vm for pt to call [phone] option 3 and then 2 to sch.  Castillo, Ashley R 01/19/2021 9:06 AM lft vm for pt to call [phone] option 3 and then 2 to sch.   I've reached out to pt three time to sch no return call.

## 2021-03-09 ENCOUNTER — Other Ambulatory Visit (INDEPENDENT_AMBULATORY_CARE_PROVIDER_SITE_OTHER): Payer: Self-pay | Admitting: Nurse Practitioner

## 2021-03-09 DIAGNOSIS — I83811 Varicose veins of right lower extremities with pain: Secondary | ICD-10-CM

## 2021-03-11 ENCOUNTER — Encounter (INDEPENDENT_AMBULATORY_CARE_PROVIDER_SITE_OTHER): Payer: Self-pay

## 2021-03-11 ENCOUNTER — Encounter (INDEPENDENT_AMBULATORY_CARE_PROVIDER_SITE_OTHER): Payer: Self-pay | Admitting: Vascular Surgery

## 2021-04-16 ENCOUNTER — Other Ambulatory Visit: Payer: Self-pay | Admitting: Physician Assistant

## 2021-04-16 DIAGNOSIS — M25511 Pain in right shoulder: Secondary | ICD-10-CM

## 2021-04-23 ENCOUNTER — Other Ambulatory Visit: Payer: Self-pay

## 2021-04-23 MED ORDER — DOXYCYCLINE HYCLATE 20 MG PO TABS: 20.0000 mg | ORAL_TABLET | Freq: Every day | ORAL | 0 refills | Status: DC

## 2021-04-26 ENCOUNTER — Telehealth: Payer: Self-pay

## 2021-04-26 ENCOUNTER — Other Ambulatory Visit: Payer: Self-pay

## 2021-04-26 NOTE — Telephone Encounter (Signed)
Called in [facility] for progesterone 50 mg 1 tab po daily #90 with 1 refills

## 2021-04-29 DIAGNOSIS — M67813 Other specified disorders of tendon, right shoulder: Secondary | ICD-10-CM | POA: Diagnosis not present

## 2021-04-29 DIAGNOSIS — M7521 Bicipital tendinitis, right shoulder: Secondary | ICD-10-CM | POA: Diagnosis not present

## 2021-05-03 ENCOUNTER — Encounter (INDEPENDENT_AMBULATORY_CARE_PROVIDER_SITE_OTHER): Payer: Self-pay | Admitting: Vascular Surgery

## 2021-05-03 ENCOUNTER — Encounter (INDEPENDENT_AMBULATORY_CARE_PROVIDER_SITE_OTHER): Payer: Self-pay

## 2021-05-05 ENCOUNTER — Encounter (INDEPENDENT_AMBULATORY_CARE_PROVIDER_SITE_OTHER): Payer: Self-pay | Admitting: Vascular Surgery

## 2021-05-20 ENCOUNTER — Ambulatory Visit (INDEPENDENT_AMBULATORY_CARE_PROVIDER_SITE_OTHER): Payer: 59

## 2021-05-20 DIAGNOSIS — Z1152 Encounter for screening for COVID-19: Secondary | ICD-10-CM | POA: Diagnosis not present

## 2021-05-20 LAB — POC SOFIA SARS ANTIGEN FIA: SARS Coronavirus 2 Ag: NEGATIVE

## 2021-06-02 ENCOUNTER — Other Ambulatory Visit: Payer: Self-pay

## 2021-06-02 DIAGNOSIS — N952 Postmenopausal atrophic vaginitis: Secondary | ICD-10-CM

## 2021-06-02 DIAGNOSIS — N951 Menopausal and female climacteric states: Secondary | ICD-10-CM

## 2021-06-02 MED ORDER — ESTRADIOL 10 MCG VA TABS: 1.0000 | ORAL_TABLET | VAGINAL | 2 refills | Status: AC

## 2021-06-02 MED ORDER — ESTRADIOL 0.025 MG/24HR TD PTTW
1.0000 | MEDICATED_PATCH | TRANSDERMAL | 5 refills | Status: DC
Start: 2021-06-03 — End: 2021-08-09

## 2021-08-09 ENCOUNTER — Other Ambulatory Visit: Payer: Self-pay

## 2021-08-09 DIAGNOSIS — N952 Postmenopausal atrophic vaginitis: Secondary | ICD-10-CM

## 2021-08-09 DIAGNOSIS — N951 Menopausal and female climacteric states: Secondary | ICD-10-CM

## 2021-08-09 MED ORDER — ESTRADIOL 0.0375 MG/24HR TD PTTW: 1.0000 | MEDICATED_PATCH | TRANSDERMAL | 3 refills | Status: DC

## 2021-10-18 ENCOUNTER — Other Ambulatory Visit: Payer: Self-pay

## 2021-10-18 MED ORDER — LEVOFLOXACIN 500 MG PO TABS: 500.0000 mg | ORAL_TABLET | Freq: Every day | ORAL | 0 refills | Status: DC

## 2021-10-21 ENCOUNTER — Other Ambulatory Visit: Payer: Self-pay | Admitting: Internal Medicine

## 2021-10-21 DIAGNOSIS — G8929 Other chronic pain: Secondary | ICD-10-CM

## 2021-10-27 ENCOUNTER — Telehealth: Payer: Self-pay | Admitting: Internal Medicine

## 2021-10-27 NOTE — Telephone Encounter (Signed)
-----   Message from Karlyne Greenspan sent at 10/27/2021 12:30 PM EST ----- Regarding: RE: MRI Ok I apologize I did not know the pt was the [name] we always have to have ofc notes due to insurance needing what is being rule out or other imaging or needing ofc notes to be sent in.  Thank you! ----- Message ----- From: Sherlene Shams, MD Sent: 10/26/2021   5:22 PM EST To: [name], CMA Subject: RE: MRI                                        Dr. Welton Castillo IS MY PATIENT.  She requested an MRI .Marland Kitchen she has not seen me for the shoulder problem I tried to order it as a Research officer, political party.  Shanda Bumps, Please let her know that her insurance would not pay for the MRI without an office visit etc to discuss the problem and offer her an appt  ----- Message ----- From: Karlyne Greenspan Sent: 10/26/2021   4:37 PM EST To: Sherlene Shams, MD, Sandy Salaam, CMA Subject: RE: MRI                                        Ok the pt is Ashley Castillo. What specialist is Dr Ashley Castillo? If Dr Ashley Castillo is advising pt to have a MRI then that would need to auth at that office. Please advise and Thank you!  ----- Message ----- From: Sherlene Shams, MD Sent: 10/26/2021  11:50 AM EST To: Karlyne Greenspan Subject: RE: MRI                                        There was no office note.  Please let Dr Ashley Castillo know what insurance will not authorized MRI without an office note  ----- Message ----- From: Karlyne Greenspan Sent: 10/21/2021   2:20 PM EST To: Sherlene Shams, MD, Sandy Salaam, CMA Subject: MRI                                            Good afternoon!  Hi are you done with the office notes so I can complete the auth with pt ins. Please and Thank you!

## 2021-10-27 NOTE — Telephone Encounter (Signed)
Ashley Castillo,  I have contacted the patient

## 2021-11-02 ENCOUNTER — Other Ambulatory Visit: Payer: Self-pay | Admitting: Physician Assistant

## 2021-11-02 DIAGNOSIS — Z8041 Family history of malignant neoplasm of ovary: Secondary | ICD-10-CM

## 2021-11-02 DIAGNOSIS — Z8742 Personal history of other diseases of the female genital tract: Secondary | ICD-10-CM

## 2021-11-10 ENCOUNTER — Other Ambulatory Visit: Payer: Self-pay

## 2021-11-10 ENCOUNTER — Ambulatory Visit (INDEPENDENT_AMBULATORY_CARE_PROVIDER_SITE_OTHER): Payer: 59

## 2021-11-10 ENCOUNTER — Other Ambulatory Visit: Payer: 59

## 2021-11-10 DIAGNOSIS — Z8742 Personal history of other diseases of the female genital tract: Secondary | ICD-10-CM | POA: Diagnosis not present

## 2021-11-10 DIAGNOSIS — Z8041 Family history of malignant neoplasm of ovary: Secondary | ICD-10-CM

## 2021-11-11 ENCOUNTER — Other Ambulatory Visit: Payer: Self-pay

## 2021-11-11 MED ORDER — PROMETHAZINE HCL 12.5 MG PO TABS: 12.5000 mg | ORAL_TABLET | Freq: Four times a day (QID) | ORAL | 0 refills | Status: DC | PRN

## 2021-11-12 NOTE — Telephone Encounter (Signed)
noted 

## 2021-11-12 NOTE — Telephone Encounter (Signed)
Pt called in regarding her MRI. Pt states she's not signed up for Mychart so she did not see the message. I advised to the pt what was sent from Dr Darrick Huntsman pt states ok she will have the ortho doctor that she's seeing now request the MRI.

## 2021-12-23 ENCOUNTER — Other Ambulatory Visit: Payer: Self-pay

## 2021-12-23 ENCOUNTER — Ambulatory Visit (INDEPENDENT_AMBULATORY_CARE_PROVIDER_SITE_OTHER): Payer: BC Managed Care – PPO | Admitting: Dermatology

## 2021-12-23 DIAGNOSIS — L988 Other specified disorders of the skin and subcutaneous tissue: Secondary | ICD-10-CM

## 2021-12-23 NOTE — Progress Notes (Signed)
° °  Follow-Up Visit   Subjective  Ashley Castillo is a 56 y.o. female who presents for the following: Facial Elastosis (Pt here for Botox ).  The following portions of the chart were reviewed this encounter and updated as appropriate:   Tobacco   Allergies   Meds   Problems   Med Hx   Surg Hx   Fam Hx      Review of Systems:  No other skin or systemic complaints except as noted in HPI or Assessment and Plan.  Objective  Well appearing patient in no apparent distress; mood and affect are within normal limits.  A focused examination was performed including face. Relevant physical exam findings are noted in the Assessment and Plan.  face Rhytides and volume loss.       Assessment & Plan  Elastosis of skin face  Botox   Frown complex 20 units  Forehead  5 units  Pt decline to have photos taken before treatment today   Intralesional injection - face Location: See attached image  Informed consent: Discussed risks (infection, pain, bleeding, bruising, swelling, allergic reaction, paralysis of nearby muscles, eyelid droop, double vision, neck weakness, difficulty breathing, headache, undesirable cosmetic result, and need for additional treatment) and benefits of the procedure, as well as the alternatives.  Informed consent was obtained.  Preparation: The area was cleansed with alcohol.  Procedure Details:  Botox was injected into the dermis with a 30-gauge needle. Pressure applied to any bleeding. Ice packs offered for swelling.  Lot Number:  [id number] Expiration:  01/13/2024  Total Units Injected:  25 units   Plan: Patient was instructed to remain upright for 4 hours. Patient was instructed to avoid massaging the face and avoid vigorous exercise for the rest of the day. Tylenol may be used for headache.  Allow 2 weeks before returning to clinic for additional dosing as needed. Patient will call for any problems.    Return if symptoms worsen or fail to improve.  IAngelique Holm, CMA, am acting as scribe for Armida Sans, MD .  Documentation: I have reviewed the above documentation for accuracy and completeness, and I agree with the above.  Armida Sans, MD

## 2021-12-23 NOTE — Patient Instructions (Signed)

## 2021-12-24 ENCOUNTER — Encounter: Payer: Self-pay | Admitting: Dermatology

## 2022-01-06 ENCOUNTER — Ambulatory Visit: Payer: 59 | Admitting: Dermatology

## 2022-01-19 DIAGNOSIS — M7541 Impingement syndrome of right shoulder: Secondary | ICD-10-CM | POA: Diagnosis not present

## 2022-01-19 DIAGNOSIS — M7501 Adhesive capsulitis of right shoulder: Secondary | ICD-10-CM | POA: Diagnosis not present

## 2022-01-20 ENCOUNTER — Other Ambulatory Visit: Payer: Self-pay

## 2022-01-20 MED ORDER — CEPHALEXIN 500 MG PO CAPS: 500.0000 mg | ORAL_CAPSULE | Freq: Three times a day (TID) | ORAL | 0 refills | Status: DC

## 2022-01-24 ENCOUNTER — Other Ambulatory Visit: Payer: Self-pay | Admitting: Physician Assistant

## 2022-01-24 DIAGNOSIS — G8929 Other chronic pain: Secondary | ICD-10-CM

## 2022-01-24 DIAGNOSIS — M25511 Pain in right shoulder: Secondary | ICD-10-CM

## 2022-01-31 ENCOUNTER — Telehealth: Payer: Self-pay

## 2022-01-31 NOTE — Telephone Encounter (Signed)
Plan of care for PT signed by Leotis Shames and faxed back to [facility] at [phone].

## 2022-02-01 ENCOUNTER — Other Ambulatory Visit: Payer: Self-pay | Admitting: Nurse Practitioner

## 2022-02-01 DIAGNOSIS — M79605 Pain in left leg: Secondary | ICD-10-CM

## 2022-02-01 DIAGNOSIS — T148XXA Other injury of unspecified body region, initial encounter: Secondary | ICD-10-CM

## 2022-02-02 ENCOUNTER — Other Ambulatory Visit: Payer: 59

## 2022-02-23 DIAGNOSIS — M25511 Pain in right shoulder: Secondary | ICD-10-CM | POA: Diagnosis not present

## 2022-02-28 ENCOUNTER — Telehealth: Payer: Self-pay | Admitting: Internal Medicine

## 2022-02-28 DIAGNOSIS — Z1231 Encounter for screening mammogram for malignant neoplasm of breast: Secondary | ICD-10-CM

## 2022-02-28 NOTE — Telephone Encounter (Signed)
Order has been placed and pt is aware.  

## 2022-02-28 NOTE — Telephone Encounter (Signed)
Patient is requesting a referral for a mammogram at [place] 

## 2022-03-04 ENCOUNTER — Telehealth: Payer: Self-pay

## 2022-03-04 NOTE — Telephone Encounter (Signed)
Plan of care for PT signed by Lauren and faxed back to [facility] at [phone]. ?

## 2022-03-08 ENCOUNTER — Telehealth: Payer: Self-pay

## 2022-03-08 NOTE — Telephone Encounter (Signed)
Plan of care for PT signed by Lynn Ito and faxed back to [facility] at [phone]. Held at front. ?

## 2022-04-07 ENCOUNTER — Ambulatory Visit (INDEPENDENT_AMBULATORY_CARE_PROVIDER_SITE_OTHER): Payer: Self-pay | Admitting: Internal Medicine

## 2022-04-07 ENCOUNTER — Encounter: Payer: Self-pay | Admitting: Internal Medicine

## 2022-04-07 DIAGNOSIS — Z79899 Other long term (current) drug therapy: Secondary | ICD-10-CM

## 2022-04-07 DIAGNOSIS — E785 Hyperlipidemia, unspecified: Secondary | ICD-10-CM

## 2022-04-07 DIAGNOSIS — Z0000 Encounter for general adult medical examination without abnormal findings: Secondary | ICD-10-CM

## 2022-04-07 DIAGNOSIS — R7301 Impaired fasting glucose: Secondary | ICD-10-CM

## 2022-04-09 NOTE — Progress Notes (Signed)
Patient failed to keep scheduled appointment and will be charged a no show fee.   

## 2022-04-09 NOTE — Assessment & Plan Note (Signed)
Patient failed to keep scheduled appointment and will be charged a no show fee.   

## 2022-04-18 ENCOUNTER — Other Ambulatory Visit: Payer: Self-pay

## 2022-04-18 MED ORDER — FLUCONAZOLE 150 MG PO TABS: 150.0000 mg | ORAL_TABLET | Freq: Every day | ORAL | 1 refills | Status: DC

## 2022-04-18 MED ORDER — NITROFURANTOIN MONOHYD MACRO 100 MG PO CAPS: 100.0000 mg | ORAL_CAPSULE | Freq: Two times a day (BID) | ORAL | 0 refills | Status: DC

## 2022-04-29 ENCOUNTER — Ambulatory Visit
Admission: RE | Admit: 2022-04-29 | Discharge: 2022-04-29 | Disposition: A | Discharge status: Home / Self Care | Payer: 59 | Source: Ambulatory Visit | Attending: Internal Medicine | Admitting: Internal Medicine

## 2022-04-29 DIAGNOSIS — Z1231 Encounter for screening mammogram for malignant neoplasm of breast: Secondary | ICD-10-CM | POA: Insufficient documentation

## 2022-05-03 ENCOUNTER — Encounter: Payer: Self-pay | Admitting: Internal Medicine

## 2022-05-03 ENCOUNTER — Ambulatory Visit (INDEPENDENT_AMBULATORY_CARE_PROVIDER_SITE_OTHER): Payer: 59 | Admitting: Internal Medicine

## 2022-05-03 ENCOUNTER — Other Ambulatory Visit (HOSPITAL_COMMUNITY)
Admission: RE | Admit: 2022-05-03 | Discharge: 2022-05-03 | Disposition: A | Discharge status: Home / Self Care | Payer: 59 | Source: Ambulatory Visit | Attending: Internal Medicine | Admitting: Internal Medicine

## 2022-05-03 VITALS — BP 104/72 | HR 83 | Ht 62.5 in | Wt 141.6 lb

## 2022-05-03 DIAGNOSIS — L819 Disorder of pigmentation, unspecified: Secondary | ICD-10-CM

## 2022-05-03 DIAGNOSIS — N951 Menopausal and female climacteric states: Secondary | ICD-10-CM

## 2022-05-03 DIAGNOSIS — R69 Illness, unspecified: Secondary | ICD-10-CM | POA: Diagnosis not present

## 2022-05-03 DIAGNOSIS — N950 Postmenopausal bleeding: Secondary | ICD-10-CM | POA: Diagnosis not present

## 2022-05-03 DIAGNOSIS — Z0000 Encounter for general adult medical examination without abnormal findings: Secondary | ICD-10-CM | POA: Insufficient documentation

## 2022-05-03 DIAGNOSIS — F5102 Adjustment insomnia: Secondary | ICD-10-CM

## 2022-05-03 MED ORDER — PROMETHAZINE HCL 12.5 MG PO TABS: 12.5000 mg | ORAL_TABLET | Freq: Four times a day (QID) | ORAL | 0 refills | Status: AC | PRN

## 2022-05-03 MED ORDER — TRETINOIN 0.025 % EX CREA: TOPICAL_CREAM | Freq: Every day | CUTANEOUS | 3 refills | Status: AC

## 2022-05-03 MED ORDER — ESTRADIOL 0.0375 MG/24HR TD PTTW: 1.0000 | MEDICATED_PATCH | TRANSDERMAL | 3 refills | Status: DC

## 2022-05-03 MED ORDER — ZOLPIDEM TARTRATE 5 MG PO TABS: 5.0000 mg | ORAL_TABLET | Freq: Every evening | ORAL | 0 refills | Status: DC | PRN

## 2022-05-03 NOTE — Assessment & Plan Note (Signed)
Managed with HRT.  Refills given

## 2022-05-03 NOTE — Assessment & Plan Note (Signed)
Managed with prn ambien 5 mg dose. .  The risks and benefits of  Hypnotic  use were discussed with patient today including excessive sedation leading to respiratory depression,  impaired thinking/driving, and addiction.  Patient was advised to avoid concurrent use with alcohol, to use medication only as needed and not to share with others  .

## 2022-05-03 NOTE — Assessment & Plan Note (Signed)
She had a menses that lasted 3-4 days last month,  After receiving a cortisone injection in her right shoulder.  Transvaginal ultrasound ordered

## 2022-05-03 NOTE — Patient Instructions (Signed)
I agree that a transvaginal ultrasound would be a good idea given your recent menses.  I will order that at [facility]

## 2022-05-03 NOTE — Assessment & Plan Note (Signed)

## 2022-05-03 NOTE — Progress Notes (Signed)
The patient is here for annual preventive examination and management of other chronic and acute problems.   The risk factors are reflected in the social history.   The roster of all physicians providing medical care to patient - is listed in the Snapshot section of the chart.   Activities of daily living:  The patient is 100% independent in all ADLs: dressing, toileting, feeding as well as independent mobility   Home safety : The patient has smoke detectors in the home. They wear seatbelts.  There are no unsecured firearms at home. There is no violence in the home.    There is no risks for hepatitis, STDs or HIV. There is no   history of blood transfusion. They have no travel history to infectious disease endemic areas of the world.   The patient has seen their dentist in the last six month. They have seen their eye doctor in the last year. The patinet  denies slight hearing difficulty with regard to whispered voices and some television programs.  They have deferred audiologic testing in the last year.  They do not  have excessive sun exposure. Discussed the need for sun protection: hats, long sleeves and use of sunscreen if there is significant sun exposure.    Diet: the importance of a healthy diet is discussed. They do have a healthy diet.   The benefits of regular aerobic exercise were discussed. The patient  exercises  3 to 5 days per week  for  60 minutes.    Depression screen: there are no signs or vegative symptoms of depression- irritability, change in appetite, anhedonia, sadness/tearfullness.   The following portions of the patient's history were reviewed and updated as appropriate: allergies, current medications, past family history, past medical history,  past surgical history, past social history  and problem list.   Visual acuity was not assessed per patient preference since the patient has regular follow up with an  ophthalmologist. Hearing and body mass index were assessed and  reviewed.    During the course of the visit the patient was educated and counseled about appropriate screening and preventive services including : fall prevention , diabetes screening, nutrition counseling, colorectal cancer screening, and recommended immunizations.    Chief Complaint:   Has been in menopause since her late 75's,  on HRT  but had  a menses after receiving a steroid injection in right shoulder. Complete with menstrual  cramps and hot flashes.  Last month .  Lasted 3-4 days     Review of Symptoms  Patient denies headache, fevers, malaise, unintentional weight loss, skin rash, eye pain, sinus congestion and sinus pain, sore throat, dysphagia,  hemoptysis , cough, dyspnea, wheezing, chest pain, palpitations, orthopnea, edema, abdominal pain, nausea, melena, diarrhea, constipation, flank pain, dysuria, hematuria, urinary  Frequency, nocturia, numbness, tingling, seizures,  Focal weakness, Loss of consciousness,  Tremor, insomnia, depression, anxiety, and suicidal ideation.    Physical Exam:  BP 104/72 (BP Location: Left Arm, Patient Position: Sitting, Cuff Size: Normal)   Pulse 83   Ht 5' 2.5" (1.588 m)   Wt 141 lb 9.6 oz (64.2 kg)   SpO2 97%   BMI 25.49 kg/m   General Appearance:    Alert, cooperative, no distress, appears stated age  Head:    Normocephalic, without obvious abnormality, atraumatic  Eyes:    PERRL, conjunctiva/corneas clear, EOM's intact, fundi    benign, both eyes  Ears:    Normal TM's and external ear canals, both ears  Nose:   Nares normal, septum midline, mucosa normal, no drainage    or sinus tenderness  Throat:   Lips, mucosa, and tongue normal; teeth and gums normal  Neck:   Supple, symmetrical, trachea midline, no adenopathy;    thyroid:  no enlargement/tenderness/nodules; no carotid   bruit or JVD  Back:     Symmetric, no curvature, ROM normal, no CVA tenderness  Lungs:     Clear to auscultation bilaterally, respirations unlabored  Chest Wall:     No tenderness or deformity   Heart:    Regular rate and rhythm, S1 and S2 normal, no murmur, rub   or gallop  Breast Exam:    No tenderness, masses, or nipple abnormality  Abdomen:     Soft, non-tender, bowel sounds active all four quadrants,    no masses, no organomegaly  Genitalia:    Pelvic: cervix normal in appearance, external genitalia normal, no adnexal masses or tenderness, no cervical motion tenderness, rectovaginal septum normal, uterus normal size, shape, and consistency and vagina normal without discharge  Extremities:   Extremities normal, atraumatic, no cyanosis or edema  Pulses:   2+ and symmetric all extremities  Skin:   Skin color, texture, turgor normal, no rashes or lesions  Lymph nodes:   Cervical, supraclavicular, and axillary nodes normal  Neurologic:   CNII-XII intact, normal strength, sensation and reflexes    throughout     Assessment and Plan:  Menopause syndrome Managed with HRT.  Refills given   Post-menopausal bleeding She had a menses that lasted 3-4 days last month,  After receiving a cortisone injection in her right shoulder.  Transvaginal ultrasound ordered   Encounter for preventive health examination age appropriate education and counseling updated, referrals for preventative services and immunizations addressed, dietary and smoking counseling addressed, most recent labs reviewed.  I have personally reviewed and have noted:   1) the patient's medical and social history 2) The pt's use of alcohol, tobacco, and illicit drugs 3) The patient's current medications and supplements 4) Functional ability including ADL's, fall risk, home safety risk, hearing and visual impairment 5) Diet and physical activities 6) Evidence for depression or mood disorder 7) The patient's height, weight, and BMI have been recorded in the chart 8) I have ordered and reviewed a 12 lead EKG and find that there are no acute changes and patient is in sinus rhythm.     I have  made referrals, and provided counseling and education based on review of the above  Insomnia due to stress Managed with prn ambien 5 mg dose. .  The risks and benefits of  Hypnotic  use were discussed with patient today including excessive sedation leading to respiratory depression,  impaired thinking/driving, and addiction.  Patient was advised to avoid concurrent use with alcohol, to use medication only as needed and not to share with others  .    Updated Medication List Outpatient Encounter Medications as of 05/03/2022  Medication Sig   diclofenac sodium (VOLTAREN) 1 % GEL Apply on topical 3 times a day   doxycycline (PERIOSTAT) 20 MG tablet Take 1 tablet (20 mg total) by mouth daily.   Estradiol 10 MCG TABS vaginal tablet Place 1 tablet (10 mcg total) vaginally 2 (two) times a week.   fluconazole (DIFLUCAN) 150 MG tablet Take 1 tablet (150 mg total) by mouth daily. Take 150 mg by mouth everyday for 3 days   [DISCONTINUED] estradiol (VIVELLE-DOT) 0.0375 MG/24HR Place 1 patch onto the skin 2 (two)  times a week.   [DISCONTINUED] promethazine (PHENERGAN) 12.5 MG tablet Take 1 tablet (12.5 mg total) by mouth every 6 (six) hours as needed for nausea or vomiting.   [DISCONTINUED] tretinoin (RETIN-A) 0.025 % cream Apply topically at bedtime.   [DISCONTINUED] zolpidem (AMBIEN) 5 MG tablet Take 1 tablet (5 mg total) by mouth at bedtime as needed for sleep.   [START ON 05/05/2022] estradiol (VIVELLE-DOT) 0.0375 MG/24HR Place 1 patch onto the skin 2 (two) times a week.   promethazine (PHENERGAN) 12.5 MG tablet Take 1 tablet (12.5 mg total) by mouth every 6 (six) hours as needed for nausea or vomiting.   tretinoin (RETIN-A) 0.025 % cream Apply topically at bedtime.   zolpidem (AMBIEN) 5 MG tablet Take 1 tablet (5 mg total) by mouth at bedtime as needed for sleep.   [DISCONTINUED] cephALEXin (KEFLEX) 500 MG capsule Take 1 capsule (500 mg total) by mouth 3 (three) times daily.   [DISCONTINUED]  nitrofurantoin, macrocrystal-monohydrate, (MACROBID) 100 MG capsule Take 1 capsule (100 mg total) by mouth 2 (two) times daily. For 10 days (Patient not taking: Reported on 05/03/2022)   No facility-administered encounter medications on file as of 05/03/2022.

## 2022-05-04 ENCOUNTER — Ambulatory Visit: Payer: 59 | Admitting: Dermatology

## 2022-05-04 ENCOUNTER — Other Ambulatory Visit (INDEPENDENT_AMBULATORY_CARE_PROVIDER_SITE_OTHER): Payer: 59

## 2022-05-04 DIAGNOSIS — Z0000 Encounter for general adult medical examination without abnormal findings: Secondary | ICD-10-CM

## 2022-05-04 LAB — CBC WITH DIFFERENTIAL/PLATELET
Basophils Absolute: 0 10*3/uL (ref 0.0–0.1)
Basophils Relative: 0.7 % (ref 0.0–3.0)
Eosinophils Absolute: 0.4 10*3/uL (ref 0.0–0.7)
Eosinophils Relative: 5.2 % — ABNORMAL HIGH (ref 0.0–5.0)
HCT: 42 % (ref 36.0–46.0)
Hemoglobin: 14.5 g/dL (ref 12.0–15.0)
Lymphocytes Relative: 40.8 % (ref 12.0–46.0)
Lymphs Abs: 2.8 10*3/uL (ref 0.7–4.0)
MCHC: 34.6 g/dL (ref 30.0–36.0)
MCV: 87.4 fl (ref 78.0–100.0)
Monocytes Absolute: 0.3 10*3/uL (ref 0.1–1.0)
Monocytes Relative: 4.7 % (ref 3.0–12.0)
Neutro Abs: 3.3 10*3/uL (ref 1.4–7.7)
Neutrophils Relative %: 48.6 % (ref 43.0–77.0)
Platelets: 220 10*3/uL (ref 150.0–400.0)
RBC: 4.8 Mil/uL (ref 3.87–5.11)
RDW: 13 % (ref 11.5–15.5)
WBC: 6.9 10*3/uL (ref 4.0–10.5)

## 2022-05-04 LAB — LIPID PANEL
Cholesterol: 232 mg/dL — ABNORMAL HIGH (ref 0–200)
HDL: 45 mg/dL (ref >39.00)
LDL Cholesterol: 160 mg/dL — ABNORMAL HIGH (ref 0–99)
NonHDL: 187.07
Total CHOL/HDL Ratio: 5
Triglycerides: 136 mg/dL (ref 0.0–149.0)
VLDL: 27.2 mg/dL (ref 0.0–40.0)

## 2022-05-04 LAB — COMPREHENSIVE METABOLIC PANEL
ALT: 17 U/L (ref 0–35)
AST: 13 U/L (ref 0–37)
Albumin: 4.3 g/dL (ref 3.5–5.2)
Alkaline Phosphatase: 51 U/L (ref 39–117)
BUN: 14 mg/dL (ref 6–23)
CO2: 29 mEq/L (ref 19–32)
Calcium: 9.2 mg/dL (ref 8.4–10.5)
Chloride: 100 mEq/L (ref 96–112)
Creatinine, Ser: 0.81 mg/dL (ref 0.40–1.20)
GFR: 81.55 mL/min (ref >60.00)
Glucose, Bld: 85 mg/dL (ref 70–99)
Potassium: 3.7 mEq/L (ref 3.5–5.1)
Sodium: 136 mEq/L (ref 135–145)
Total Bilirubin: 0.7 mg/dL (ref 0.2–1.2)
Total Protein: 7 g/dL (ref 6.0–8.3)

## 2022-05-04 LAB — VITAMIN D 25 HYDROXY (VIT D DEFICIENCY, FRACTURES): VITD: 23.02 ng/mL — ABNORMAL LOW (ref 30.00–100.00)

## 2022-05-04 LAB — TSH: TSH: 3.07 u[IU]/mL (ref 0.35–5.50)

## 2022-05-04 LAB — HEMOGLOBIN A1C: Hgb A1c MFr Bld: 5.5 % (ref 4.6–6.5)

## 2022-05-05 LAB — CYTOLOGY - PAP
Adequacy: ABSENT
Comment: NEGATIVE
Diagnosis: NEGATIVE
High risk HPV: NEGATIVE

## 2022-05-17 ENCOUNTER — Telehealth: Payer: Self-pay

## 2022-05-17 NOTE — Telephone Encounter (Signed)
PA for Retin-A cream has been submitted on covermymeds.  

## 2022-05-18 ENCOUNTER — Ambulatory Visit: Payer: 59

## 2022-05-20 ENCOUNTER — Telehealth: Payer: Self-pay | Admitting: Internal Medicine

## 2022-05-26 NOTE — Telephone Encounter (Signed)
error 

## 2022-06-01 NOTE — Telephone Encounter (Signed)
PA was denied. Medication is only by insurance if pt has a diagnosis of acne vulgaris or keratosis follicularis.

## 2022-06-10 ENCOUNTER — Ambulatory Visit
Admission: RE | Admit: 2022-06-10 | Discharge: 2022-06-10 | Disposition: A | Discharge status: Home / Self Care | Payer: 59 | Source: Ambulatory Visit | Attending: Internal Medicine | Admitting: Internal Medicine

## 2022-06-10 DIAGNOSIS — N950 Postmenopausal bleeding: Secondary | ICD-10-CM | POA: Diagnosis not present

## 2022-06-10 NOTE — Assessment & Plan Note (Signed)
Endometrial complex 3 mm  (normal).  Exam c/w atrophy . Trial of vaginal estrogen offered. Continue Vivelle.

## 2022-07-14 ENCOUNTER — Other Ambulatory Visit: Payer: Self-pay

## 2022-07-14 MED ORDER — ESTRADIOL 0.0375 MG/24HR TD PTTW: 1.0000 | MEDICATED_PATCH | TRANSDERMAL | 3 refills | Status: DC

## 2022-07-27 ENCOUNTER — Other Ambulatory Visit: Payer: Self-pay

## 2022-07-27 MED ORDER — FAMCICLOVIR 125 MG PO TABS: 125.0000 mg | ORAL_TABLET | Freq: Two times a day (BID) | ORAL | 3 refills | Status: AC

## 2022-09-13 ENCOUNTER — Encounter: Payer: Self-pay | Admitting: Obstetrics and Gynecology

## 2022-09-13 NOTE — Progress Notes (Deleted)
    GYNECOLOGY PROGRESS NOTE  Subjective:    Patient ID: Ashley Castillo, female    DOB: [birth year], 56 y.o.   MRN: [id number]  HPI  Patient is a 56 y.o. No obstetric history on file. female who presents for evaluation of postmenopausal bleeding.  The following portions of the patient's history were reviewed and updated as appropriate: allergies, current medications, past family history, past medical history, past social history, past surgical history, and problem list.  Review of Systems Pertinent items noted in HPI and remainder of comprehensive ROS otherwise negative.   Objective:   There were no vitals taken for this visit. There is no height or weight on file to calculate BMI. General appearance: {general exam:16600} Abdomen: {abdominal exam:16834} Pelvic: {pelvic exam:16852::"cervix normal in appearance","external genitalia normal","no adnexal masses or tenderness","no cervical motion tenderness","rectovaginal septum normal","uterus normal size, shape, and consistency","vagina normal without discharge"} Extremities: {extremity exam:5109} Neurologic: {neuro exam:17854}   Assessment:   No diagnosis found.   Plan:   There are no diagnoses linked to this encounter.     Rubie Maid, MD [facility]

## 2022-10-10 ENCOUNTER — Encounter (INDEPENDENT_AMBULATORY_CARE_PROVIDER_SITE_OTHER): Payer: Self-pay

## 2022-10-27 ENCOUNTER — Ambulatory Visit: Payer: 59 | Admitting: Dermatology

## 2022-10-27 ENCOUNTER — Ambulatory Visit (INDEPENDENT_AMBULATORY_CARE_PROVIDER_SITE_OTHER): Payer: 59 | Admitting: Dermatology

## 2022-10-27 ENCOUNTER — Other Ambulatory Visit: Payer: Self-pay | Admitting: Physician Assistant

## 2022-10-27 DIAGNOSIS — L820 Inflamed seborrheic keratosis: Secondary | ICD-10-CM

## 2022-10-27 MED ORDER — CELECOXIB 100 MG PO CAPS: 100.0000 mg | ORAL_CAPSULE | Freq: Two times a day (BID) | ORAL | 2 refills | Status: DC

## 2022-10-27 MED ORDER — ICOSAPENT ETHYL 1 G PO CAPS: 1.0000 g | ORAL_CAPSULE | Freq: Two times a day (BID) | ORAL | 2 refills | Status: AC

## 2022-10-27 NOTE — Patient Instructions (Addendum)
Cryotherapy Aftercare  Wash gently with soap and water everyday.   Apply Vaseline and Band-Aid daily until healed.     Due to recent changes in healthcare laws, you may see results of your pathology and/or laboratory studies on MyChart before the doctors have had a chance to review them. We understand that in some cases there may be results that are confusing or concerning to you. Please understand that not all results are received at the same time and often the doctors may need to interpret multiple results in order to provide you with the best plan of care or course of treatment. Therefore, we ask that you please give us 2 business days to thoroughly review all your results before contacting the office for clarification. Should we see a critical lab result, you will be contacted sooner.   If You Need Anything After Your Visit  If you have any questions or concerns for your doctor, please call our main line at [phone] and press option 4 to reach your doctor's medical assistant. If no one answers, please leave a voicemail as directed and we will return your call as soon as possible. Messages left after 4 pm will be answered the following business day.   You may also send us a message via MyChart. We typically respond to MyChart messages within 1-2 business days.  For prescription refills, please ask your pharmacy to contact our office. Our fax number is [phone].  If you have an urgent issue when the clinic is closed that cannot wait until the next business day, you can page your doctor at the number below.    Please note that while we do our best to be available for urgent issues outside of office hours, we are not available 24/7.   If you have an urgent issue and are unable to reach us, you may choose to seek medical care at your doctor's office, retail clinic, urgent care center, or emergency room.  If you have a medical emergency, please immediately call 911 or go to the  emergency department.  Pager Numbers  - Dr. Kowalski: [phone]  - Dr. Moye: [phone]  - Dr. Stewart: [phone]  In the event of inclement weather, please call our main line at [phone] for an update on the status of any delays or closures.  Dermatology Medication Tips: Please keep the boxes that topical medications come in in order to help keep track of the instructions about where and how to use these. Pharmacies typically print the medication instructions only on the boxes and not directly on the medication tubes.   If your medication is too expensive, please contact our office at [phone] option 4 or send us a message through MyChart.   We are unable to tell what your co-pay for medications will be in advance as this is different depending on your insurance coverage. However, we may be able to find a substitute medication at lower cost or fill out paperwork to get insurance to cover a needed medication.   If a prior authorization is required to get your medication covered by your insurance company, please allow us 1-2 business days to complete this process.  Drug prices often vary depending on where the prescription is filled and some pharmacies may offer cheaper prices.  The website www.goodrx.com contains coupons for medications through different pharmacies. The prices here do not account for what the cost may be with help from insurance (it may be cheaper with your insurance), but the website can   give you the price if you did not use any insurance.  - You can print the associated coupon and take it with your prescription to the pharmacy.  - You may also stop by our office during regular business hours and pick up a GoodRx coupon card.  - If you need your prescription sent electronically to a different pharmacy, notify our office through [facility] MyChart or by phone at [phone] option 4.     Si Usted Necesita Algo Despus de Su Visita  Tambin puede  enviarnos un mensaje a travs de MyChart. Por lo general respondemos a los mensajes de MyChart en el transcurso de 1 a 2 das hbiles.  Para renovar recetas, por favor pida a su farmacia que se ponga en contacto con nuestra oficina. Nuestro nmero de fax es el [phone].  Si tiene un asunto urgente cuando la clnica est cerrada y que no puede esperar hasta el siguiente da hbil, puede llamar/localizar a su doctor(a) al nmero que aparece a continuacin.   Por favor, tenga en cuenta que aunque hacemos todo lo posible para estar disponibles para asuntos urgentes fuera del horario de oficina, no estamos disponibles las 24 horas del da, los 7 das de la semana.   Si tiene un problema urgente y no puede comunicarse con nosotros, puede optar por buscar atencin mdica  en el consultorio de su doctor(a), en una clnica privada, en un centro de atencin urgente o en una sala de emergencias.  Si tiene una emergencia mdica, por favor llame inmediatamente al 911 o vaya a la sala de emergencias.  Nmeros de bper  - Dr. Kowalski: [phone]  - Dra. Moye: [phone]  - Dra. Stewart: [phone]  En caso de inclemencias del tiempo, por favor llame a nuestra lnea principal al [phone] para una actualizacin sobre el estado de cualquier retraso o cierre.  Consejos para la medicacin en dermatologa: Por favor, guarde las cajas en las que vienen los medicamentos de uso tpico para ayudarle a seguir las instrucciones sobre dnde y cmo usarlos. Las farmacias generalmente imprimen las instrucciones del medicamento slo en las cajas y no directamente en los tubos del medicamento.   Si su medicamento es muy caro, por favor, pngase en contacto con nuestra oficina llamando al [phone] y presione la opcin 4 o envenos un mensaje a travs de MyChart.   No podemos decirle cul ser su copago por los medicamentos por adelantado ya que esto es diferente dependiendo de la cobertura de su seguro.  Sin embargo, es posible que podamos encontrar un medicamento sustituto a menor costo o llenar un formulario para que el seguro cubra el medicamento que se considera necesario.   Si se requiere una autorizacin previa para que su compaa de seguros cubra su medicamento, por favor permtanos de 1 a 2 das hbiles para completar este proceso.  Los precios de los medicamentos varan con frecuencia dependiendo del lugar de dnde se surte la receta y alguna farmacias pueden ofrecer precios ms baratos.  El sitio web www.goodrx.com tiene cupones para medicamentos de diferentes farmacias. Los precios aqu no tienen en cuenta lo que podra costar con la ayuda del seguro (puede ser ms barato con su seguro), pero el sitio web puede darle el precio si no utiliz ningn seguro.  - Puede imprimir el cupn correspondiente y llevarlo con su receta a la farmacia.  - Tambin puede pasar por nuestra oficina durante el horario de atencin regular y recoger una tarjeta de cupones de GoodRx.  -   Si necesita que su receta se enve electrnicamente a una farmacia diferente, informe a nuestra oficina a travs de MyChart de [facility] o por telfono llamando al [phone] y presione la opcin 4.  

## 2022-10-27 NOTE — Progress Notes (Signed)
   Follow-Up Visit   Subjective  Ashley Castillo is a 56 y.o. female who presents for the following: Skin Problem (The patient has lesion on her face to be evaluated, some may be new or changing.).  The following portions of the chart were reviewed this encounter and updated as appropriate:   Tobacco  Allergies  Meds  Problems  Med Hx  Surg Hx  Fam Hx     Review of Systems:  No other skin or systemic complaints except as noted in HPI or Assessment and Plan.  Objective  Well appearing patient in no apparent distress; mood and affect are within normal limits.  A focused examination was performed including face. Relevant physical exam findings are noted in the Assessment and Plan.  right temple within hairline x 1 Stuck-on, waxy, tan-brown papule --Discussed benign etiology and prognosis.    Assessment & Plan  Inflamed seborrheic keratosis right temple within hairline x 1  Symptomatic, irritating, patient would like treated.   Destruction of lesion - right temple within hairline x 1 Complexity: simple   Destruction method: cryotherapy   Informed consent: discussed and consent obtained   Timeout:  patient name, date of birth, surgical site, and procedure verified Lesion destroyed using liquid nitrogen: Yes   Region frozen until ice ball extended beyond lesion: Yes   Outcome: patient tolerated procedure well with no complications   Post-procedure details: wound care instructions given     Return if symptoms worsen or fail to improve.  IAngelique Holm, CMA, am acting as scribe for Armida Sans, MD .  Documentation: I have reviewed the above documentation for accuracy and completeness, and I agree with the above.  Armida Sans, MD

## 2022-11-14 ENCOUNTER — Other Ambulatory Visit: Payer: Self-pay

## 2022-11-14 MED ORDER — ESTRADIOL 0.0375 MG/24HR TD PTTW: 1.0000 | MEDICATED_PATCH | TRANSDERMAL | 3 refills | Status: DC

## 2022-11-15 ENCOUNTER — Encounter: Payer: Self-pay | Admitting: Dermatology

## 2022-11-24 ENCOUNTER — Other Ambulatory Visit: Payer: Self-pay | Admitting: Nurse Practitioner

## 2022-11-24 MED ORDER — ACETAMINOPHEN-CODEINE 300-30 MG PO TABS: 0.5000 | ORAL_TABLET | Freq: Two times a day (BID) | ORAL | 0 refills | Status: AC | PRN

## 2022-11-24 MED ORDER — AZITHROMYCIN 250 MG PO TABS: ORAL_TABLET | ORAL | 0 refills | Status: AC

## 2022-11-28 ENCOUNTER — Other Ambulatory Visit: Payer: Self-pay

## 2022-11-28 ENCOUNTER — Telehealth: Payer: Self-pay

## 2022-11-28 MED ORDER — LINACLOTIDE 72 MCG PO CAPS: 72.0000 ug | ORAL_CAPSULE | Freq: Every day | ORAL | 3 refills | Status: DC

## 2022-11-28 MED ORDER — PREDNISONE 10 MG PO TABS: ORAL_TABLET | ORAL | 0 refills | Status: AC

## 2022-11-28 NOTE — Telephone Encounter (Signed)
Sent P.A. for Linzess .

## 2022-12-15 ENCOUNTER — Other Ambulatory Visit: Payer: Self-pay

## 2022-12-15 ENCOUNTER — Telehealth: Payer: Self-pay | Admitting: Internal Medicine

## 2022-12-15 MED ORDER — MOMETASONE FURO-FORMOTEROL FUM 100-5 MCG/ACT IN AERO: 2.0000 | INHALATION_SPRAY | Freq: Every day | RESPIRATORY_TRACT | 3 refills | Status: DC

## 2022-12-15 NOTE — Telephone Encounter (Deleted)
Pt needs refill

## 2022-12-23 NOTE — Telephone Encounter (Signed)
error 

## 2023-01-24 ENCOUNTER — Other Ambulatory Visit: Payer: Self-pay

## 2023-01-24 MED ORDER — HYDROQUINONE 4 % EX CREA: TOPICAL_CREAM | CUTANEOUS | 0 refills | Status: DC

## 2023-03-22 ENCOUNTER — Other Ambulatory Visit: Payer: Self-pay

## 2023-03-22 MED ORDER — DOXYCYCLINE HYCLATE 20 MG PO TABS: 20.0000 mg | ORAL_TABLET | Freq: Every day | ORAL | 1 refills | Status: DC

## 2023-04-04 ENCOUNTER — Other Ambulatory Visit: Payer: Self-pay | Admitting: Physician Assistant

## 2023-04-28 DIAGNOSIS — M546 Pain in thoracic spine: Secondary | ICD-10-CM | POA: Diagnosis not present

## 2023-04-28 DIAGNOSIS — M542 Cervicalgia: Secondary | ICD-10-CM | POA: Diagnosis not present

## 2023-04-28 DIAGNOSIS — M25511 Pain in right shoulder: Secondary | ICD-10-CM | POA: Diagnosis not present

## 2023-04-28 DIAGNOSIS — M9902 Segmental and somatic dysfunction of thoracic region: Secondary | ICD-10-CM | POA: Diagnosis not present

## 2023-04-28 DIAGNOSIS — M9903 Segmental and somatic dysfunction of lumbar region: Secondary | ICD-10-CM | POA: Diagnosis not present

## 2023-04-28 DIAGNOSIS — M545 Low back pain, unspecified: Secondary | ICD-10-CM | POA: Diagnosis not present

## 2023-04-28 DIAGNOSIS — M9901 Segmental and somatic dysfunction of cervical region: Secondary | ICD-10-CM | POA: Diagnosis not present

## 2023-05-01 DIAGNOSIS — M546 Pain in thoracic spine: Secondary | ICD-10-CM | POA: Diagnosis not present

## 2023-05-01 DIAGNOSIS — M9907 Segmental and somatic dysfunction of upper extremity: Secondary | ICD-10-CM | POA: Diagnosis not present

## 2023-05-01 DIAGNOSIS — M9903 Segmental and somatic dysfunction of lumbar region: Secondary | ICD-10-CM | POA: Diagnosis not present

## 2023-05-01 DIAGNOSIS — M542 Cervicalgia: Secondary | ICD-10-CM | POA: Diagnosis not present

## 2023-05-01 DIAGNOSIS — M9902 Segmental and somatic dysfunction of thoracic region: Secondary | ICD-10-CM | POA: Diagnosis not present

## 2023-05-03 DIAGNOSIS — M9903 Segmental and somatic dysfunction of lumbar region: Secondary | ICD-10-CM | POA: Diagnosis not present

## 2023-05-03 DIAGNOSIS — M9907 Segmental and somatic dysfunction of upper extremity: Secondary | ICD-10-CM | POA: Diagnosis not present

## 2023-05-03 DIAGNOSIS — M9902 Segmental and somatic dysfunction of thoracic region: Secondary | ICD-10-CM | POA: Diagnosis not present

## 2023-05-03 DIAGNOSIS — M546 Pain in thoracic spine: Secondary | ICD-10-CM | POA: Diagnosis not present

## 2023-05-03 DIAGNOSIS — M9901 Segmental and somatic dysfunction of cervical region: Secondary | ICD-10-CM | POA: Diagnosis not present

## 2023-05-03 DIAGNOSIS — M542 Cervicalgia: Secondary | ICD-10-CM | POA: Diagnosis not present

## 2023-05-09 DIAGNOSIS — M9903 Segmental and somatic dysfunction of lumbar region: Secondary | ICD-10-CM | POA: Diagnosis not present

## 2023-05-09 DIAGNOSIS — M542 Cervicalgia: Secondary | ICD-10-CM | POA: Diagnosis not present

## 2023-05-09 DIAGNOSIS — M9907 Segmental and somatic dysfunction of upper extremity: Secondary | ICD-10-CM | POA: Diagnosis not present

## 2023-05-09 DIAGNOSIS — M546 Pain in thoracic spine: Secondary | ICD-10-CM | POA: Diagnosis not present

## 2023-05-09 DIAGNOSIS — M9902 Segmental and somatic dysfunction of thoracic region: Secondary | ICD-10-CM | POA: Diagnosis not present

## 2023-05-09 DIAGNOSIS — M9901 Segmental and somatic dysfunction of cervical region: Secondary | ICD-10-CM | POA: Diagnosis not present

## 2023-05-10 DIAGNOSIS — M359 Systemic involvement of connective tissue, unspecified: Secondary | ICD-10-CM | POA: Diagnosis not present

## 2023-05-10 DIAGNOSIS — M25512 Pain in left shoulder: Secondary | ICD-10-CM | POA: Diagnosis not present

## 2023-05-11 DIAGNOSIS — M25512 Pain in left shoulder: Secondary | ICD-10-CM | POA: Diagnosis not present

## 2023-05-11 DIAGNOSIS — M25511 Pain in right shoulder: Secondary | ICD-10-CM | POA: Diagnosis not present

## 2023-05-12 DIAGNOSIS — M542 Cervicalgia: Secondary | ICD-10-CM | POA: Diagnosis not present

## 2023-05-12 DIAGNOSIS — M9901 Segmental and somatic dysfunction of cervical region: Secondary | ICD-10-CM | POA: Diagnosis not present

## 2023-05-12 DIAGNOSIS — M9907 Segmental and somatic dysfunction of upper extremity: Secondary | ICD-10-CM | POA: Diagnosis not present

## 2023-05-12 DIAGNOSIS — M9903 Segmental and somatic dysfunction of lumbar region: Secondary | ICD-10-CM | POA: Diagnosis not present

## 2023-05-12 DIAGNOSIS — M546 Pain in thoracic spine: Secondary | ICD-10-CM | POA: Diagnosis not present

## 2023-05-12 DIAGNOSIS — M9902 Segmental and somatic dysfunction of thoracic region: Secondary | ICD-10-CM | POA: Diagnosis not present

## 2023-05-15 DIAGNOSIS — M542 Cervicalgia: Secondary | ICD-10-CM | POA: Diagnosis not present

## 2023-05-15 DIAGNOSIS — M9903 Segmental and somatic dysfunction of lumbar region: Secondary | ICD-10-CM | POA: Diagnosis not present

## 2023-05-15 DIAGNOSIS — M9902 Segmental and somatic dysfunction of thoracic region: Secondary | ICD-10-CM | POA: Diagnosis not present

## 2023-05-15 DIAGNOSIS — M9907 Segmental and somatic dysfunction of upper extremity: Secondary | ICD-10-CM | POA: Diagnosis not present

## 2023-05-15 DIAGNOSIS — M546 Pain in thoracic spine: Secondary | ICD-10-CM | POA: Diagnosis not present

## 2023-05-15 DIAGNOSIS — M9901 Segmental and somatic dysfunction of cervical region: Secondary | ICD-10-CM | POA: Diagnosis not present

## 2023-05-17 DIAGNOSIS — M546 Pain in thoracic spine: Secondary | ICD-10-CM | POA: Diagnosis not present

## 2023-05-17 DIAGNOSIS — M9903 Segmental and somatic dysfunction of lumbar region: Secondary | ICD-10-CM | POA: Diagnosis not present

## 2023-05-17 DIAGNOSIS — M9907 Segmental and somatic dysfunction of upper extremity: Secondary | ICD-10-CM | POA: Diagnosis not present

## 2023-05-17 DIAGNOSIS — M542 Cervicalgia: Secondary | ICD-10-CM | POA: Diagnosis not present

## 2023-05-17 DIAGNOSIS — M9901 Segmental and somatic dysfunction of cervical region: Secondary | ICD-10-CM | POA: Diagnosis not present

## 2023-05-17 DIAGNOSIS — M9902 Segmental and somatic dysfunction of thoracic region: Secondary | ICD-10-CM | POA: Diagnosis not present

## 2023-05-19 DIAGNOSIS — M542 Cervicalgia: Secondary | ICD-10-CM | POA: Diagnosis not present

## 2023-05-19 DIAGNOSIS — M546 Pain in thoracic spine: Secondary | ICD-10-CM | POA: Diagnosis not present

## 2023-05-19 DIAGNOSIS — M9907 Segmental and somatic dysfunction of upper extremity: Secondary | ICD-10-CM | POA: Diagnosis not present

## 2023-05-19 DIAGNOSIS — M9901 Segmental and somatic dysfunction of cervical region: Secondary | ICD-10-CM | POA: Diagnosis not present

## 2023-05-19 DIAGNOSIS — M9902 Segmental and somatic dysfunction of thoracic region: Secondary | ICD-10-CM | POA: Diagnosis not present

## 2023-05-19 DIAGNOSIS — M9903 Segmental and somatic dysfunction of lumbar region: Secondary | ICD-10-CM | POA: Diagnosis not present

## 2023-05-23 DIAGNOSIS — M546 Pain in thoracic spine: Secondary | ICD-10-CM | POA: Diagnosis not present

## 2023-05-23 DIAGNOSIS — M542 Cervicalgia: Secondary | ICD-10-CM | POA: Diagnosis not present

## 2023-05-23 DIAGNOSIS — M9907 Segmental and somatic dysfunction of upper extremity: Secondary | ICD-10-CM | POA: Diagnosis not present

## 2023-05-23 DIAGNOSIS — M9901 Segmental and somatic dysfunction of cervical region: Secondary | ICD-10-CM | POA: Diagnosis not present

## 2023-05-23 DIAGNOSIS — M9902 Segmental and somatic dysfunction of thoracic region: Secondary | ICD-10-CM | POA: Diagnosis not present

## 2023-05-23 DIAGNOSIS — M9903 Segmental and somatic dysfunction of lumbar region: Secondary | ICD-10-CM | POA: Diagnosis not present

## 2023-05-30 DIAGNOSIS — M9902 Segmental and somatic dysfunction of thoracic region: Secondary | ICD-10-CM | POA: Diagnosis not present

## 2023-05-30 DIAGNOSIS — M9907 Segmental and somatic dysfunction of upper extremity: Secondary | ICD-10-CM | POA: Diagnosis not present

## 2023-05-30 DIAGNOSIS — M9903 Segmental and somatic dysfunction of lumbar region: Secondary | ICD-10-CM | POA: Diagnosis not present

## 2023-05-30 DIAGNOSIS — M9901 Segmental and somatic dysfunction of cervical region: Secondary | ICD-10-CM | POA: Diagnosis not present

## 2023-05-30 DIAGNOSIS — M542 Cervicalgia: Secondary | ICD-10-CM | POA: Diagnosis not present

## 2023-05-30 DIAGNOSIS — M546 Pain in thoracic spine: Secondary | ICD-10-CM | POA: Diagnosis not present

## 2023-06-05 ENCOUNTER — Other Ambulatory Visit: Payer: Self-pay

## 2023-06-05 MED ORDER — ROSUVASTATIN CALCIUM 5 MG PO TABS: ORAL_TABLET | ORAL | 1 refills | Status: DC

## 2023-06-05 NOTE — Telephone Encounter (Signed)
Send crestor as per [employer]

## 2023-06-20 ENCOUNTER — Other Ambulatory Visit: Payer: Self-pay | Admitting: Nurse Practitioner

## 2023-06-20 DIAGNOSIS — Z1231 Encounter for screening mammogram for malignant neoplasm of breast: Secondary | ICD-10-CM

## 2023-08-17 ENCOUNTER — Ambulatory Visit
Admission: RE | Admit: 2023-08-17 | Discharge: 2023-08-17 | Disposition: A | Discharge status: Home / Self Care | Payer: 59 | Source: Ambulatory Visit | Attending: Nurse Practitioner | Admitting: Nurse Practitioner

## 2023-08-17 DIAGNOSIS — Z1231 Encounter for screening mammogram for malignant neoplasm of breast: Secondary | ICD-10-CM | POA: Diagnosis not present

## 2023-08-26 ENCOUNTER — Other Ambulatory Visit: Payer: Self-pay

## 2023-08-26 MED ORDER — HYDROQUINONE 4 % EX CREA: TOPICAL_CREAM | CUTANEOUS | 0 refills | Status: AC

## 2023-08-29 ENCOUNTER — Other Ambulatory Visit: Payer: Self-pay | Admitting: Nurse Practitioner

## 2023-08-29 DIAGNOSIS — E039 Hypothyroidism, unspecified: Secondary | ICD-10-CM

## 2023-08-29 DIAGNOSIS — Z13828 Encounter for screening for other musculoskeletal disorder: Secondary | ICD-10-CM

## 2023-08-30 DIAGNOSIS — Z13828 Encounter for screening for other musculoskeletal disorder: Secondary | ICD-10-CM | POA: Diagnosis not present

## 2023-08-30 DIAGNOSIS — E039 Hypothyroidism, unspecified: Secondary | ICD-10-CM | POA: Diagnosis not present

## 2023-09-01 ENCOUNTER — Telehealth: Payer: Self-pay

## 2023-09-01 LAB — TSH+FREE T4
Free T4: 1.25 ng/dL (ref 0.82–1.77)
TSH: 2.64 u[IU]/mL (ref 0.450–4.500)

## 2023-09-01 LAB — CYCLIC CITRUL PEPTIDE ANTIBODY, IGG/IGA: Cyclic Citrullin Peptide Ab: 26 units — ABNORMAL HIGH (ref 0–19)

## 2023-09-01 LAB — ANA W/REFLEX IF POSITIVE: Anti Nuclear Antibody (ANA): NEGATIVE

## 2023-09-01 NOTE — Telephone Encounter (Signed)
Called in [facility] for progesterone 50 mg 1 tab po daily  and vegan #90 with 1 refills

## 2023-09-05 ENCOUNTER — Ambulatory Visit: Payer: 59 | Admitting: Dermatology

## 2023-09-14 ENCOUNTER — Ambulatory Visit: Payer: 59 | Admitting: Dermatology

## 2023-09-14 ENCOUNTER — Encounter: Payer: Self-pay | Admitting: Dermatology

## 2023-09-14 VITALS — BP 113/75 | HR 68

## 2023-09-14 DIAGNOSIS — B079 Viral wart, unspecified: Secondary | ICD-10-CM

## 2023-09-14 NOTE — Patient Instructions (Addendum)
Viral Wart (HPV) Counseling  Discussed viral / HPV (Human Papilloma Virus) etiology and risk of spread /infectivity to other areas of body as well as to other people.  Multiple treatments and methods may be required to clear warts and it is possible treatment may not be successful.  Treatment risks include discoloration; scarring and there is still potential for wart recurrence.   Cryotherapy Aftercare  Wash gently with soap and water everyday.   Apply Vaseline and Band-Aid daily until healed.     Due to recent changes in healthcare laws, you may see results of your pathology and/or laboratory studies on MyChart before the doctors have had a chance to review them. We understand that in some cases there may be results that are confusing or concerning to you. Please understand that not all results are received at the same time and often the doctors may need to interpret multiple results in order to provide you with the best plan of care or course of treatment. Therefore, we ask that you please give [place] 2 business days to thoroughly review all your results before contacting the office for clarification. Should we see a critical lab result, you will be contacted sooner.   If You Need Anything After Your Visit  If you have any questions or concerns for your doctor, please call our main line at [phone] and press option 4 to reach your doctor's medical assistant. If no one answers, please leave a voicemail as directed and we will return your call as soon as possible. Messages left after 4 pm will be answered the following business day.   You may also send [place] a message via MyChart. We typically respond to MyChart messages within 1-2 business days.  For prescription refills, please ask your pharmacy to contact our office. Our fax number is [phone].  If you have an urgent issue when the clinic is closed that cannot wait until the next business day, you can page your doctor at the number below.     Please note that while we do our best to be available for urgent issues outside of office hours, we are not available 24/7.   If you have an urgent issue and are unable to reach [place], you may choose to seek medical care at your doctor's office, retail clinic, urgent care center, or emergency room.  If you have a medical emergency, please immediately call 911 or go to the emergency department.  Pager Numbers  - Dr. Gwen Pounds: [phone]  - Dr. Roseanne Reno: [phone]  - Dr. Katrinka Blazing: [phone]   In the event of inclement weather, please call our main line at [phone] for an update on the status of any delays or closures.  Dermatology Medication Tips: Please keep the boxes that topical medications come in in order to help keep track of the instructions about where and how to use these. Pharmacies typically print the medication instructions only on the boxes and not directly on the medication tubes.   If your medication is too expensive, please contact our office at [phone] option 4 or send [place] a message through MyChart.   We are unable to tell what your co-pay for medications will be in advance as this is different depending on your insurance coverage. However, we may be able to find a substitute medication at lower cost or fill out paperwork to get insurance to cover a needed medication.   If a prior authorization is required to get your medication covered by your insurance company, please  allow [place] 1-2 business days to complete this process.  Drug prices often vary depending on where the prescription is filled and some pharmacies may offer cheaper prices.  The website www.goodrx.com contains coupons for medications through different pharmacies. The prices here do not account for what the cost may be with help from insurance (it may be cheaper with your insurance), but the website can give you the price if you did not use any insurance.  - You can print the associated coupon and  take it with your prescription to the pharmacy.  - You may also stop by our office during regular business hours and pick up a GoodRx coupon card.  - If you need your prescription sent electronically to a different pharmacy, notify our office through [facility] or by phone at [phone] option 4.     Si Usted Necesita Algo Despus de Su Visita  Tambin puede enviarnos un mensaje a travs de Clinical cytogeneticist. Por lo general respondemos a los mensajes de MyChart en el transcurso de 1 a 2 das hbiles.  Para renovar recetas, por favor pida a su farmacia que se ponga en contacto con nuestra oficina. Annie Sable de fax es [place] [phone].  Si tiene un asunto urgente cuando la clnica est cerrada y que no puede esperar hasta el siguiente da hbil, puede llamar/localizar a su doctor(a) al nmero que aparece a continuacin.   Por favor, tenga en cuenta que aunque hacemos todo lo posible para estar disponibles para asuntos urgentes fuera del horario de [place], no estamos disponibles las 24 horas del da, los 7 [address]  [address] de la [place].   Si tiene un problema urgente y no puede comunicarse con nosotros, puede optar por buscar atencin mdica  en el consultorio de su doctor(a), en una clnica privada, en un centro de atencin urgente o en una sala de emergencias.  Si tiene Engineer, drilling, por favor llame inmediatamente al 911 o vaya a la sala de emergencias.  Nmeros de bper  - Dr. Gwen Pounds: [phone]  - Dra. Roseanne Reno: [phone]  - Dr. Katrinka Blazing: [phone]   En caso de inclemencias del tiempo, por favor llame a Lacy Duverney principal al [phone] para una actualizacin sobre el [place] de cualquier retraso o cierre.  Consejos para la medicacin en dermatologa: Por favor, guarde las cajas en las que vienen los medicamentos de uso tpico para ayudarle a seguir las instrucciones sobre dnde y cmo usarlos. Las farmacias generalmente imprimen las instrucciones del medicamento slo  en las cajas y no directamente en los tubos del [place].   Si su medicamento es muy caro, por favor, pngase en contacto con Rolm Gala llamando al [phone] y presione la opcin 4 o envenos un mensaje a travs de Clinical cytogeneticist.   No podemos decirle cul ser su copago por los medicamentos por adelantado ya que esto es diferente dependiendo de la cobertura de su seguro. Sin embargo, es posible que podamos encontrar un medicamento sustituto a Audiological scientist un formulario para que el seguro cubra el medicamento que se considera necesario.   Si se requiere una autorizacin previa para que su compaa de seguros [place] su medicamento, por favor permtanos de 1 a 2 das hbiles para completar [address]  Los precios de los medicamentos varan con frecuencia dependiendo del Environmental consultant de dnde se surte la receta y alguna farmacias pueden ofrecer precios ms baratos.  El sitio web www.goodrx.com tiene cupones para medicamentos de Health and safety inspector. Los precios aqu no tienen en cuenta lo  que podra costar con la ayuda del seguro (puede ser ms barato con su seguro), pero el sitio web puede darle el precio si no Visual merchandiser.  - Puede imprimir el cupn correspondiente y llevarlo con su receta a la farmacia.  - Tambin puede pasar por nuestra oficina durante el horario de atencin regular y Education officer, museum una tarjeta de cupones de GoodRx.  - Si necesita que su receta se enve electrnicamente a una farmacia diferente, informe a nuestra oficina a travs de MyChart de [place] o por telfono llamando al [phone] y presione la opcin 4.

## 2023-09-14 NOTE — Progress Notes (Signed)
   Follow-Up Visit   Subjective  Ashley Castillo is a 57 y.o. female who presents for the following: Patient here today concerning a spot she noticed in summer at her right 5th finger. Reports bump will not go away. Denies symptoms.    The patient has spots, moles and lesions to be evaluated, some may be new or changing and the patient may have concern these could be cancer.   The following portions of the chart were reviewed this encounter and updated as appropriate: medications, allergies, medical history  Review of Systems:  No other skin or systemic complaints except as noted in HPI or Assessment and Plan.  Objective  Well appearing patient in no apparent distress; mood and affect are within normal limits.   A focused examination was performed of the following areas: Right 5th finger, right hand   Relevant exam findings are noted in the Assessment and Plan.  right 5th finger x 1 Verrucous papules -- Discussed viral etiology and contagion.     Assessment & Plan     Viral warts, unspecified type right 5th finger x 1  Viral Wart (HPV) Counseling  Discussed viral / HPV (Human Papilloma Virus) etiology and risk of spread /infectivity to other areas of body as well as to other people.  Multiple treatments and methods may be required to clear warts and it is possible treatment may not be successful.  Treatment risks include discoloration; scarring and there is still potential for wart recurrence.  Discussed cryotherapy vs cream treatment   Patient prefers cryotherapy to treat area today  Destruction of lesion - right 5th finger x 1 Complexity: simple   Destruction method: cryotherapy   Informed consent: discussed and consent obtained   Timeout:  patient name, date of birth, surgical site, and procedure verified Lesion destroyed using liquid nitrogen: Yes   Region frozen until ice ball extended beyond lesion: Yes   Cryo cycles: 1 or 2. Outcome: patient tolerated procedure  well with no complications   Post-procedure details: wound care instructions given      Return for 4 week wart follow up.  I, Asher Muir, CMA, am acting as scribe for Elie Goody, MD.   Documentation: I have reviewed the above documentation for accuracy and completeness, and I agree with the above.  Elie Goody, MD

## 2023-09-15 ENCOUNTER — Other Ambulatory Visit: Payer: Self-pay | Admitting: Internal Medicine

## 2023-09-15 DIAGNOSIS — Z1211 Encounter for screening for malignant neoplasm of colon: Secondary | ICD-10-CM

## 2023-09-15 DIAGNOSIS — Z1212 Encounter for screening for malignant neoplasm of rectum: Secondary | ICD-10-CM

## 2023-09-16 ENCOUNTER — Other Ambulatory Visit: Payer: Self-pay | Admitting: Nurse Practitioner

## 2023-09-16 DIAGNOSIS — M2550 Pain in unspecified joint: Secondary | ICD-10-CM

## 2023-09-16 DIAGNOSIS — R768 Other specified abnormal immunological findings in serum: Secondary | ICD-10-CM

## 2023-09-16 NOTE — Progress Notes (Signed)
Elevated anti-CCP at 26 Fatigue and arthralgias.  Refer to rheumatology

## 2023-09-18 ENCOUNTER — Telehealth: Payer: Self-pay | Admitting: Nurse Practitioner

## 2023-09-18 NOTE — Telephone Encounter (Signed)
Rheumatology referral sent via [facility] to [facility] 

## 2023-09-20 ENCOUNTER — Telehealth: Payer: Self-pay | Admitting: Nurse Practitioner

## 2023-09-20 NOTE — Telephone Encounter (Signed)
Rheumatology appointment 09/27/2023 @ [facility]

## 2023-09-26 ENCOUNTER — Other Ambulatory Visit: Payer: Self-pay | Admitting: Nurse Practitioner

## 2023-09-26 DIAGNOSIS — M069 Rheumatoid arthritis, unspecified: Secondary | ICD-10-CM | POA: Diagnosis not present

## 2023-09-26 DIAGNOSIS — M0609 Rheumatoid arthritis without rheumatoid factor, multiple sites: Secondary | ICD-10-CM | POA: Diagnosis not present

## 2023-09-26 DIAGNOSIS — M2550 Pain in unspecified joint: Secondary | ICD-10-CM | POA: Diagnosis not present

## 2023-09-26 DIAGNOSIS — Z7960 Long term (current) use of unspecified immunomodulators and immunosuppressants: Secondary | ICD-10-CM | POA: Diagnosis not present

## 2023-09-26 DIAGNOSIS — M7989 Other specified soft tissue disorders: Secondary | ICD-10-CM | POA: Diagnosis not present

## 2023-09-27 DIAGNOSIS — M7989 Other specified soft tissue disorders: Secondary | ICD-10-CM | POA: Diagnosis not present

## 2023-09-27 DIAGNOSIS — M069 Rheumatoid arthritis, unspecified: Secondary | ICD-10-CM | POA: Diagnosis not present

## 2023-09-27 DIAGNOSIS — M2550 Pain in unspecified joint: Secondary | ICD-10-CM | POA: Diagnosis not present

## 2023-10-03 ENCOUNTER — Other Ambulatory Visit: Payer: Self-pay

## 2023-10-03 MED ORDER — LANSOPRAZOLE 30 MG PO CPDR: 30.0000 mg | DELAYED_RELEASE_CAPSULE | Freq: Every day | ORAL | 5 refills | Status: AC

## 2023-10-13 ENCOUNTER — Other Ambulatory Visit: Payer: Self-pay

## 2023-10-13 MED ORDER — FLUCONAZOLE 150 MG PO TABS: ORAL_TABLET | ORAL | 1 refills | Status: DC

## 2023-10-19 ENCOUNTER — Ambulatory Visit: Payer: 59 | Admitting: Dermatology

## 2023-10-21 ENCOUNTER — Other Ambulatory Visit: Payer: Self-pay | Admitting: Nurse Practitioner

## 2023-10-28 ENCOUNTER — Other Ambulatory Visit: Payer: Self-pay | Admitting: Nurse Practitioner

## 2023-10-31 ENCOUNTER — Encounter: Payer: 59 | Admitting: Internal Medicine

## 2024-01-16 ENCOUNTER — Ambulatory Visit (LOCAL_COMMUNITY_HEALTH_CENTER): Payer: Self-pay

## 2024-01-16 DIAGNOSIS — Z719 Counseling, unspecified: Secondary | ICD-10-CM

## 2024-01-16 DIAGNOSIS — Z23 Encounter for immunization: Secondary | ICD-10-CM

## 2024-01-16 NOTE — Progress Notes (Signed)
 In nurse clinic for Menveo. Traveling to [place] later this month. Per [facility], documentation that Menveo was given 05/18/2023. However, Patient states this vaccine was never given. Patient plans to contact [facility] to have them delete this administration.  Consult with Dr JAYSON Helling and informed that patient is older than recommended age for Menveo. Menveo is typically given through age 58. Per Dr JAYSON Helling, ok to give Menveo today.  Menveo given and tolerated well. Updated [facility] copy given and explained. Reegan Bouffard, RN

## 2024-01-16 NOTE — Progress Notes (Signed)
 Attestation of Attending Supervision of RN:  I was consulted regarding this patient case and agree with the documentation below.   I authorized vaccination as patient is traveling to high risk location.   Dorothyann Helling, MD Clinical Services Medical Director [facility] Department 01/16/24  5:22 PM

## 2024-01-23 ENCOUNTER — Other Ambulatory Visit: Payer: Self-pay | Admitting: Nurse Practitioner

## 2024-01-23 MED ORDER — MOMETASONE FURO-FORMOTEROL FUM 100-5 MCG/ACT IN AERO: 2.0000 | INHALATION_SPRAY | Freq: Every day | RESPIRATORY_TRACT | 3 refills | Status: DC

## 2024-01-23 MED ORDER — EPINEPHRINE 0.3 MG/0.3ML IJ SOAJ: 0.3000 mg | INTRAMUSCULAR | 2 refills | Status: AC | PRN

## 2024-01-24 ENCOUNTER — Other Ambulatory Visit: Payer: Self-pay

## 2024-01-24 MED ORDER — FLUTICASONE-SALMETEROL 250-50 MCG/ACT IN AEPB: 1.0000 | INHALATION_SPRAY | Freq: Two times a day (BID) | RESPIRATORY_TRACT | 3 refills | Status: DC

## 2024-02-26 ENCOUNTER — Other Ambulatory Visit: Payer: Self-pay

## 2024-02-26 MED ORDER — PIMECROLIMUS 1 % EX CREA: 1.0000 | TOPICAL_CREAM | Freq: Two times a day (BID) | CUTANEOUS | 1 refills | Status: AC

## 2024-04-17 ENCOUNTER — Other Ambulatory Visit: Payer: Self-pay

## 2024-04-17 ENCOUNTER — Other Ambulatory Visit: Payer: Self-pay | Admitting: Physician Assistant

## 2024-04-17 MED ORDER — AZITHROMYCIN 250 MG PO TABS: ORAL_TABLET | ORAL | 0 refills | Status: DC

## 2024-05-07 ENCOUNTER — Other Ambulatory Visit: Payer: Self-pay

## 2024-05-07 MED ORDER — ESTRADIOL 0.0375 MG/24HR TD PTTW: 1.0000 | MEDICATED_PATCH | TRANSDERMAL | 3 refills | Status: AC

## 2024-05-10 ENCOUNTER — Ambulatory Visit: Admitting: Nurse Practitioner

## 2024-05-10 VITALS — BP 108/78 | HR 62 | Temp 97.7°F | Ht 62.5 in | Wt 149.4 lb

## 2024-05-10 DIAGNOSIS — R1011 Right upper quadrant pain: Secondary | ICD-10-CM

## 2024-05-10 DIAGNOSIS — Z780 Asymptomatic menopausal state: Secondary | ICD-10-CM | POA: Diagnosis not present

## 2024-05-10 DIAGNOSIS — M0609 Rheumatoid arthritis without rheumatoid factor, multiple sites: Secondary | ICD-10-CM | POA: Diagnosis not present

## 2024-05-10 DIAGNOSIS — M25512 Pain in left shoulder: Secondary | ICD-10-CM

## 2024-05-10 DIAGNOSIS — G8929 Other chronic pain: Secondary | ICD-10-CM | POA: Diagnosis not present

## 2024-05-10 NOTE — Progress Notes (Unsigned)
  Bluford Burkitt, NP-C Phone: [phone]  Ashley Castillo is a 58 y.o. female who presents today for left shoulder pain and orthopedics referral.   ***  Social History   Tobacco Use  Smoking Status Never  Smokeless Tobacco Never    Current Outpatient Medications on File Prior to Visit  Medication Sig Dispense Refill   acetaminophen -codeine  (TYLENOL  #3) 300-30 MG tablet Take 0.5-1 tablets by mouth 2 (two) times daily as needed (cough). 20 tablet 0   azithromycin  (ZITHROMAX ) 250 MG tablet Use as directed for 5 days 6 each 0   celecoxib  (CELEBREX ) 100 MG capsule TAKE 1 CAPSULE BY MOUTH TWICE DAILY 60 capsule 2   diclofenac  sodium (VOLTAREN ) 1 % GEL Apply on topical 3 times a day 100 g 2   doxycycline  (PERIOSTAT ) 20 MG tablet Take 1 tablet (20 mg total) by mouth daily. 90 tablet 1   EPINEPHrine  0.3 mg/0.3 mL IJ SOAJ injection Inject 0.3 mg into the muscle as needed for anaphylaxis. 1 each 2   estradiol  (VIVELLE -DOT) 0.0375 MG/24HR Place 1 patch onto the skin 2 (two) times a week. 24 patch 3   Estradiol  10 MCG TABS vaginal tablet Place 1 tablet (10 mcg total) vaginally 2 (two) times a week. 24 tablet 2   famciclovir  (FAMVIR ) 125 MG tablet Take 1 tablet (125 mg total) by mouth 2 (two) times daily. 60 tablet 3   fluticasone -salmeterol (ADVAIR) 250-50 MCG/ACT AEPB Inhale 1 puff into the lungs in the morning and at bedtime. 60 each 3   hydroquinone  4 % cream Use as directed topical 2 times daily 28.35 g 0   icosapent  Ethyl (VASCEPA ) 1 g capsule Take 1 capsule (1 g total) by mouth 2 (two) times daily. 60 capsule 2   lansoprazole  (PREVACID ) 30 MG capsule Take 1 capsule (30 mg total) by mouth daily at 12 noon. 30 capsule 5   linaclotide  (LINZESS ) 72 MCG capsule Take 1 capsule (72 mcg total) by mouth daily before breakfast. 30 capsule 3   pimecrolimus  (ELIDEL ) 1 % cream Apply 1 Application topically 2 (two) times daily. 30 g 1   predniSONE  (DELTASONE ) 10 MG tablet Use as directed for 6 days 21 tablet 0    promethazine  (PHENERGAN ) 12.5 MG tablet Take 1 tablet (12.5 mg total) by mouth every 6 (six) hours as needed for nausea or vomiting. 15 tablet 0   rosuvastatin  (CRESTOR ) 5 MG tablet TAKE ONE TABLET BY MOUTH EVERY DAY 90 tablet 1   tretinoin  (RETIN-A ) 0.025 % cream Apply topically at bedtime. 45 g 3   zolpidem  (AMBIEN ) 5 MG tablet Take 1 tablet (5 mg total) by mouth at bedtime as needed for sleep. 90 tablet 0   No current facility-administered medications on file prior to visit.     ROS see history of present illness  Objective  Physical Exam Vitals:   05/10/24 0814  BP: 108/78  Pulse: 62  Temp: 97.7 F (36.5 C)  SpO2: 94%    BP Readings from Last 3 Encounters:  05/10/24 108/78  09/14/23 113/75  05/03/22 104/72   Wt Readings from Last 3 Encounters:  05/10/24 149 lb 6.4 oz (67.8 kg)  05/03/22 141 lb 9.6 oz (64.2 kg)  01/13/21 150 lb 9.6 oz (68.3 kg)    Physical Exam   Assessment/Plan: Please see individual problem list.  There are no diagnoses linked to this encounter.   Health Maintenance: ***  No follow-ups on file.   Bluford Burkitt, NP-C [facility]

## 2024-05-13 DIAGNOSIS — R1011 Right upper quadrant pain: Secondary | ICD-10-CM | POA: Insufficient documentation

## 2024-05-13 DIAGNOSIS — G8929 Other chronic pain: Secondary | ICD-10-CM | POA: Insufficient documentation

## 2024-05-13 NOTE — Progress Notes (Unsigned)
 Bluford Burkitt, NP-C Phone: [phone]  Ashley Castillo is a 58 y.o. female who presents today for left shoulder pain and orthopedics referral.   Discussed the use of AI scribe software for clinical note transcription with the patient, who gave verbal consent to proceed.  History of Present Illness   Ashley Castillo is a 58 year old female with rheumatoid arthritis who presents with left shoulder pain and requests a referral for further management.  She experiences left shoulder pain and has previously undergone physical therapy. She is currently taking Celebrex  and prednisone  for management. She is seeking a referral to an orthopedic specialist in [place].  She was diagnosed with rheumatoid arthritis and has been on Plaquenil for six months. She reports morning stiffness and increased sluggishness, and feels that her rheumatoid arthritis is not under control. She has a strong family history of connective tissue diseases, including scleroderma and interstitial lung disease in her mother.  She is experiencing right upper quadrant pain, sometimes epigastric, with associated nausea but no vomiting. She reports bloating and occasional episodes of discomfort. No blood in stools, fevers, chills, or trouble swallowing. She is concerned about the impact of NSAIDs on her gastrointestinal health.  She requests a bone density scan as she has never had one before. She also mentions a previous colonoscopy done during COVID by Dr. Man at [facility], with results indicating she is clear for another five years.  She requests a refill of Ambien , which she uses for long flights to manage anxiety and sleep disturbances. She typically refills it once a year.      Social History   Tobacco Use  Smoking Status Never  Smokeless Tobacco Never    Current Outpatient Medications on File Prior to Visit  Medication Sig Dispense Refill   acetaminophen -codeine  (TYLENOL  #3) 300-30 MG tablet Take 0.5-1 tablets by mouth 2  (two) times daily as needed (cough). 20 tablet 0   azithromycin  (ZITHROMAX ) 250 MG tablet Use as directed for 5 days 6 each 0   celecoxib  (CELEBREX ) 100 MG capsule TAKE 1 CAPSULE BY MOUTH TWICE DAILY 60 capsule 2   diclofenac  sodium (VOLTAREN ) 1 % GEL Apply on topical 3 times a day 100 g 2   doxycycline  (PERIOSTAT ) 20 MG tablet Take 1 tablet (20 mg total) by mouth daily. 90 tablet 1   EPINEPHrine  0.3 mg/0.3 mL IJ SOAJ injection Inject 0.3 mg into the muscle as needed for anaphylaxis. 1 each 2   estradiol  (VIVELLE -DOT) 0.0375 MG/24HR Place 1 patch onto the skin 2 (two) times a week. 24 patch 3   Estradiol  10 MCG TABS vaginal tablet Place 1 tablet (10 mcg total) vaginally 2 (two) times a week. 24 tablet 2   famciclovir  (FAMVIR ) 125 MG tablet Take 1 tablet (125 mg total) by mouth 2 (two) times daily. 60 tablet 3   hydroquinone  4 % cream Use as directed topical 2 times daily 28.35 g 0   icosapent  Ethyl (VASCEPA ) 1 g capsule Take 1 capsule (1 g total) by mouth 2 (two) times daily. 60 capsule 2   lansoprazole  (PREVACID ) 30 MG capsule Take 1 capsule (30 mg total) by mouth daily at 12 noon. 30 capsule 5   linaclotide  (LINZESS ) 72 MCG capsule Take 1 capsule (72 mcg total) by mouth daily before breakfast. 30 capsule 3   pimecrolimus  (ELIDEL ) 1 % cream Apply 1 Application topically 2 (two) times daily. 30 g 1   predniSONE  (DELTASONE ) 10 MG tablet Use as directed for 6 days 21 tablet  0   promethazine  (PHENERGAN ) 12.5 MG tablet Take 1 tablet (12.5 mg total) by mouth every 6 (six) hours as needed for nausea or vomiting. 15 tablet 0   rosuvastatin  (CRESTOR ) 5 MG tablet TAKE ONE TABLET BY MOUTH EVERY DAY 90 tablet 1   tretinoin  (RETIN-A ) 0.025 % cream Apply topically at bedtime. 45 g 3   zolpidem  (AMBIEN ) 5 MG tablet Take 1 tablet (5 mg total) by mouth at bedtime as needed for sleep. 90 tablet 0   No current facility-administered medications on file prior to visit.     ROS see history of present  illness  Objective  Physical Exam Vitals:   05/10/24 0814  BP: 108/78  Pulse: 62  Temp: 97.7 F (36.5 C)  SpO2: 94%    BP Readings from Last 3 Encounters:  05/10/24 108/78  09/14/23 113/75  05/03/22 104/72   Wt Readings from Last 3 Encounters:  05/10/24 149 lb 6.4 oz (67.8 kg)  05/03/22 141 lb 9.6 oz (64.2 kg)  01/13/21 150 lb 9.6 oz (68.3 kg)    Physical Exam Constitutional:      General: She is not in acute distress.    Appearance: Normal appearance.  HENT:     Head: Normocephalic.  Cardiovascular:     Rate and Rhythm: Normal rate and regular rhythm.     Heart sounds: Normal heart sounds.  Pulmonary:     Effort: Pulmonary effort is normal.     Breath sounds: Normal breath sounds.  Abdominal:     General: Abdomen is flat. Bowel sounds are normal.     Palpations: Abdomen is soft.     Tenderness: There is no abdominal tenderness.  Skin:    General: Skin is warm and dry.  Neurological:     General: No focal deficit present.     Mental Status: She is alert.  Psychiatric:        Mood and Affect: Mood normal.        Behavior: Behavior normal.      Assessment/Plan: Please see individual problem list.  Chronic left shoulder pain Assessment & Plan: She experiences chronic left shoulder pain managed with physical therapy and Celebrex , and is currently on prednisone . An injection is being considered for pain relief. Refer her to Dr. Yvonne Hering in [place] for an orthopedic evaluation and potential injection.  Orders: -     Ambulatory referral to Orthopedics  Right upper quadrant abdominal pain Assessment & Plan: She has intermittent right upper quadrant pain with nausea and epigastric discomfort. The differential includes NSAID-induced gastritis. Order a complete abdominal ultrasound.  Orders: -     US  Abdomen Complete; Future  Rheumatoid arthritis of multiple sites with negative rheumatoid factor (HCC) Assessment & Plan: Her rheumatoid arthritis is poorly  controlled, with increased morning stiffness and a strong family history of connective tissue diseases. She has been on Plaquenil for 6 months and requires an ophthalmology checkup. Refer her to [facility] for rheumatology care.  Orders: -     Ambulatory referral to Rheumatology  Postmenopausal estrogen deficiency -     DG Bone Density; Future    Return if symptoms worsen or fail to improve.   Bluford Burkitt, NP-C [facility]

## 2024-05-15 ENCOUNTER — Encounter: Payer: Self-pay | Admitting: Nurse Practitioner

## 2024-05-15 ENCOUNTER — Other Ambulatory Visit: Payer: Self-pay | Admitting: Nurse Practitioner

## 2024-05-15 DIAGNOSIS — M0609 Rheumatoid arthritis without rheumatoid factor, multiple sites: Secondary | ICD-10-CM | POA: Insufficient documentation

## 2024-05-15 NOTE — Assessment & Plan Note (Signed)
 She has intermittent right upper quadrant pain with nausea and epigastric discomfort. The differential includes NSAID-induced gastritis. Order a complete abdominal ultrasound.

## 2024-05-15 NOTE — Assessment & Plan Note (Signed)
 She experiences chronic left shoulder pain managed with physical therapy and Celebrex , and is currently on prednisone . An injection is being considered for pain relief. Refer her to Dr. Yvonne Hering in [place] for an orthopedic evaluation and potential injection.

## 2024-05-15 NOTE — Assessment & Plan Note (Signed)
 Her rheumatoid arthritis is poorly controlled, with increased morning stiffness and a strong family history of connective tissue diseases. She has been on Plaquenil for 6 months and requires an ophthalmology checkup. Refer her to [facility] for rheumatology care.

## 2024-05-21 DIAGNOSIS — M542 Cervicalgia: Secondary | ICD-10-CM | POA: Diagnosis not present

## 2024-05-21 DIAGNOSIS — S46012A Strain of muscle(s) and tendon(s) of the rotator cuff of left shoulder, initial encounter: Secondary | ICD-10-CM | POA: Diagnosis not present

## 2024-05-23 ENCOUNTER — Ambulatory Visit

## 2024-05-29 ENCOUNTER — Telehealth: Payer: Self-pay

## 2024-05-29 NOTE — Telephone Encounter (Signed)
 Letter was received from [facility] stating they have tried to contact pt for an appt. This letter has been mailed to the pt on today notifying them that they can call to schedule.

## 2024-05-30 DIAGNOSIS — M25512 Pain in left shoulder: Secondary | ICD-10-CM | POA: Diagnosis not present

## 2024-06-05 ENCOUNTER — Other Ambulatory Visit: Payer: Self-pay

## 2024-06-05 MED ORDER — HYDROXYCHLOROQUINE SULFATE 200 MG PO TABS: 200.0000 mg | ORAL_TABLET | Freq: Every day | ORAL | 11 refills | Status: AC

## 2024-06-05 NOTE — Telephone Encounter (Signed)
 As per alyssa sent Plaquenil 200 mg daily

## 2024-06-06 ENCOUNTER — Ambulatory Visit
Admission: RE | Admit: 2024-06-06 | Discharge: 2024-06-06 | Disposition: A | Discharge status: Home / Self Care | Source: Ambulatory Visit | Attending: Nurse Practitioner | Admitting: Nurse Practitioner

## 2024-06-06 DIAGNOSIS — R1013 Epigastric pain: Secondary | ICD-10-CM | POA: Diagnosis not present

## 2024-06-06 DIAGNOSIS — R1011 Right upper quadrant pain: Secondary | ICD-10-CM | POA: Insufficient documentation

## 2024-06-07 ENCOUNTER — Ambulatory Visit: Payer: Self-pay | Admitting: Nurse Practitioner

## 2024-06-07 ENCOUNTER — Telehealth: Payer: Self-pay | Admitting: Internal Medicine

## 2024-06-07 NOTE — Telephone Encounter (Unsigned)
 Copied from CRM [id number]. Topic: Clinical - Medication Refill >> Jun 07, 2024  2:05 PM Mercedes MATSU wrote: Medication:  zolpidem  (AMBIEN ) 5 MG tablet   Has the patient contacted their pharmacy? Yes (Agent: If no, request that the patient contact the pharmacy for the refill. If patient does not wish to contact the pharmacy document the reason why and proceed with request.) (Agent: If yes, when and what did the pharmacy advise?)  This is the patient's preferred pharmacy:  [facility] - [place], [place] - [address] [place] [address] Phone: [phone] Fax: [phone]  Is this the correct pharmacy for this prescription? Yes If no, delete pharmacy and type the correct one.   Has the prescription been filled recently? Yes  Is the patient out of the medication? Yes  Has the patient been seen for an appointment in the last year OR does the patient have an upcoming appointment? Yes  Can we respond through MyChart? Yes  Agent: Please be advised that Rx refills may take up to 3 business days. We ask that you follow-up with your pharmacy.

## 2024-06-08 DIAGNOSIS — M1990 Unspecified osteoarthritis, unspecified site: Secondary | ICD-10-CM | POA: Insufficient documentation

## 2024-06-08 DIAGNOSIS — M359 Systemic involvement of connective tissue, unspecified: Secondary | ICD-10-CM | POA: Insufficient documentation

## 2024-06-08 DIAGNOSIS — K219 Gastro-esophageal reflux disease without esophagitis: Secondary | ICD-10-CM | POA: Insufficient documentation

## 2024-06-08 DIAGNOSIS — R1310 Dysphagia, unspecified: Secondary | ICD-10-CM | POA: Insufficient documentation

## 2024-06-08 DIAGNOSIS — K2990 Gastroduodenitis, unspecified, without bleeding: Secondary | ICD-10-CM | POA: Insufficient documentation

## 2024-06-11 ENCOUNTER — Other Ambulatory Visit: Payer: Self-pay | Admitting: Internal Medicine

## 2024-06-11 DIAGNOSIS — M25512 Pain in left shoulder: Secondary | ICD-10-CM | POA: Diagnosis not present

## 2024-06-11 MED ORDER — ZOLPIDEM TARTRATE 5 MG PO TABS: 5.0000 mg | ORAL_TABLET | Freq: Every evening | ORAL | 5 refills | Status: AC | PRN

## 2024-06-12 ENCOUNTER — Other Ambulatory Visit (HOSPITAL_COMMUNITY): Payer: Self-pay

## 2024-06-12 ENCOUNTER — Telehealth: Payer: Self-pay

## 2024-06-12 NOTE — Telephone Encounter (Signed)
 Pharmacy Patient Advocate Encounter  Received notification from [facility] that Prior Authorization for Zolpidem  Tartrate 5MG  tablets has been APPROVED from 06/12/2024 to 12/09/2024. Ran test claim, Copay is $2.11. This test claim was processed through [facility]- copay amounts may vary at other pharmacies due to pharmacy/plan contracts, or as the patient moves through the different stages of their insurance plan.   PA #/Case ID/Reference #: [id number]

## 2024-06-12 NOTE — Telephone Encounter (Signed)
 LMTCB. Need to let pt know that Zolpidem  has been approved and the copay is $2.11.

## 2024-06-12 NOTE — Telephone Encounter (Signed)
 Pharmacy Patient Advocate Encounter   Received notification from CoverMyMeds that prior authorization for Zolpidem  Tartrate 5MG  tablets is required/requested.   Insurance verification completed.   The patient is insured through [facility] .   Per test claim: PA required; PA submitted to above mentioned insurance via CoverMyMeds Key/confirmation #/EOC [id number] Status is pending

## 2024-06-17 ENCOUNTER — Other Ambulatory Visit: Payer: Self-pay

## 2024-06-17 ENCOUNTER — Other Ambulatory Visit: Payer: Self-pay | Admitting: Cardiovascular Disease

## 2024-06-17 MED ORDER — AZITHROMYCIN 250 MG PO TABS: ORAL_TABLET | ORAL | 0 refills | Status: AC

## 2024-06-17 MED ORDER — FAMCICLOVIR 500 MG PO TABS: 500.0000 mg | ORAL_TABLET | Freq: Two times a day (BID) | ORAL | 0 refills | Status: AC

## 2024-06-19 DIAGNOSIS — M75122 Complete rotator cuff tear or rupture of left shoulder, not specified as traumatic: Secondary | ICD-10-CM | POA: Diagnosis not present

## 2024-06-19 DIAGNOSIS — M19012 Primary osteoarthritis, left shoulder: Secondary | ICD-10-CM | POA: Diagnosis not present

## 2024-06-19 DIAGNOSIS — M7582 Other shoulder lesions, left shoulder: Secondary | ICD-10-CM | POA: Diagnosis not present

## 2024-06-19 DIAGNOSIS — S46112A Strain of muscle, fascia and tendon of long head of biceps, left arm, initial encounter: Secondary | ICD-10-CM | POA: Diagnosis not present

## 2024-06-19 DIAGNOSIS — Y999 Unspecified external cause status: Secondary | ICD-10-CM | POA: Diagnosis not present

## 2024-06-19 DIAGNOSIS — S43432A Superior glenoid labrum lesion of left shoulder, initial encounter: Secondary | ICD-10-CM | POA: Diagnosis not present

## 2024-06-19 DIAGNOSIS — M24112 Other articular cartilage disorders, left shoulder: Secondary | ICD-10-CM | POA: Diagnosis not present

## 2024-06-19 DIAGNOSIS — G8918 Other acute postprocedural pain: Secondary | ICD-10-CM | POA: Diagnosis not present

## 2024-06-19 DIAGNOSIS — X58XXXA Exposure to other specified factors, initial encounter: Secondary | ICD-10-CM | POA: Diagnosis not present

## 2024-06-19 DIAGNOSIS — M25812 Other specified joint disorders, left shoulder: Secondary | ICD-10-CM | POA: Diagnosis not present

## 2024-06-20 ENCOUNTER — Other Ambulatory Visit: Payer: Self-pay

## 2024-06-20 MED ORDER — MOMETASONE FURO-FORMOTEROL FUM 100-5 MCG/ACT IN AERO: 2.0000 | INHALATION_SPRAY | Freq: Two times a day (BID) | RESPIRATORY_TRACT | 3 refills | Status: AC

## 2024-06-20 MED ORDER — ALBUTEROL SULFATE HFA 108 (90 BASE) MCG/ACT IN AERS: 1.0000 | INHALATION_SPRAY | Freq: Four times a day (QID) | RESPIRATORY_TRACT | 2 refills | Status: AC | PRN

## 2024-06-21 DIAGNOSIS — M75102 Unspecified rotator cuff tear or rupture of left shoulder, not specified as traumatic: Secondary | ICD-10-CM | POA: Diagnosis not present

## 2024-06-21 DIAGNOSIS — M25512 Pain in left shoulder: Secondary | ICD-10-CM | POA: Diagnosis not present

## 2024-06-24 DIAGNOSIS — M25512 Pain in left shoulder: Secondary | ICD-10-CM | POA: Diagnosis not present

## 2024-06-24 DIAGNOSIS — M75102 Unspecified rotator cuff tear or rupture of left shoulder, not specified as traumatic: Secondary | ICD-10-CM | POA: Diagnosis not present

## 2024-06-24 LAB — COMPREHENSIVE METABOLIC PANEL WITH GFR
ALT: 28 IU/L (ref 0–32)
AST: 18 IU/L (ref 0–40)
Albumin: 4.3 g/dL (ref 3.8–4.9)
Alkaline Phosphatase: 67 IU/L (ref 44–121)
BUN/Creatinine Ratio: 20 (ref 9–23)
BUN: 14 mg/dL (ref 6–24)
Bilirubin Total: 0.3 mg/dL (ref 0.0–1.2)
CO2: 22 mmol/L (ref 20–29)
Calcium: 9.5 mg/dL (ref 8.7–10.2)
Chloride: 101 mmol/L (ref 96–106)
Creatinine, Ser: 0.7 mg/dL (ref 0.57–1.00)
Globulin, Total: 2.6 g/dL (ref 1.5–4.5)
Potassium: 4.2 mmol/L (ref 3.5–5.2)
Sodium: 140 mmol/L (ref 134–144)
Total Protein: 6.9 g/dL (ref 6.0–8.5)
eGFR: 101 mL/min/1.73 (ref >59)

## 2024-06-24 LAB — CBC WITH DIFFERENTIAL/PLATELET
Basophils Absolute: 0.1 x10E3/uL (ref 0.0–0.2)
Basos: 1 %
EOS (ABSOLUTE): 0.4 x10E3/uL (ref 0.0–0.4)
Eos: 5 %
Hematocrit: 44.1 % (ref 34.0–46.6)
Hemoglobin: 14.7 g/dL (ref 11.1–15.9)
Immature Grans (Abs): 0 x10E3/uL (ref 0.0–0.1)
Immature Granulocytes: 0 %
Lymphocytes Absolute: 3.1 x10E3/uL (ref 0.7–3.1)
Lymphs: 41 %
MCH: 29.6 pg (ref 26.6–33.0)
MCHC: 33.3 g/dL (ref 31.5–35.7)
MCV: 89 fL (ref 79–97)
Monocytes Absolute: 0.4 x10E3/uL (ref 0.1–0.9)
Monocytes: 5 %
Neutrophils Absolute: 3.8 x10E3/uL (ref 1.4–7.0)
Neutrophils: 48 %
Platelets: 277 x10E3/uL (ref 150–450)
RBC: 4.97 x10E6/uL (ref 3.77–5.28)
RDW: 12.5 % (ref 11.7–15.4)
WBC: 7.7 x10E3/uL (ref 3.4–10.8)

## 2024-06-24 LAB — DIABETES RISK ASYMPTOM ADULTS
Glucose, Plasma: 62 mg/dL — ABNORMAL LOW (ref 70–99)
Hgb A1c MFr Bld: 5.4 % (ref 4.8–5.6)

## 2024-06-24 LAB — B12 AND FOLATE PANEL
Folate: >20 ng/mL (ref >3.0)
Vitamin B-12: 507 pg/mL (ref 232–1245)

## 2024-06-24 LAB — HIGH SENSITIVITY CRP: CRP, High Sensitivity: 1.87 mg/L (ref 0.00–3.00)

## 2024-06-24 LAB — T4, FREE: Free T4: 1.27 ng/dL (ref 0.82–1.77)

## 2024-06-24 LAB — LIPID CASCADE W/RFLX TO APOLIB
Cholesterol, Total: 229 mg/dL — ABNORMAL HIGH (ref 100–199)
HDL: 45 mg/dL (ref >39)
LDL Chol Calc (NIH): 157 mg/dL — ABNORMAL HIGH (ref 0–99)
LDL/HDL Ratio: 3.5 ratio — ABNORMAL HIGH (ref 0.0–3.2)
Total Non-HDL-Chol (LDL+VLDL): 184 mg/dL — ABNORMAL HIGH (ref 0–129)
Triglycerides: 148 mg/dL (ref 0–149)

## 2024-06-24 LAB — ANTI-CCP AB, IGG + IGA (RDL): Anti-CCP Ab, IgG + IgA (RDL): <20 U (ref <20)

## 2024-06-24 LAB — RHEUMATOID FACTOR: Rheumatoid fact SerPl-aCnc: <10 [IU]/mL (ref <14.0)

## 2024-06-24 LAB — FERRITIN: Ferritin: 102 ng/mL (ref 15–150)

## 2024-06-24 LAB — TSH: TSH: 2.8 u[IU]/mL (ref 0.450–4.500)

## 2024-06-25 ENCOUNTER — Ambulatory Visit: Payer: Self-pay | Admitting: Cardiovascular Disease

## 2024-06-27 DIAGNOSIS — M25512 Pain in left shoulder: Secondary | ICD-10-CM | POA: Diagnosis not present

## 2024-06-27 DIAGNOSIS — M75102 Unspecified rotator cuff tear or rupture of left shoulder, not specified as traumatic: Secondary | ICD-10-CM | POA: Diagnosis not present

## 2024-07-01 DIAGNOSIS — M75102 Unspecified rotator cuff tear or rupture of left shoulder, not specified as traumatic: Secondary | ICD-10-CM | POA: Diagnosis not present

## 2024-07-01 DIAGNOSIS — M25512 Pain in left shoulder: Secondary | ICD-10-CM | POA: Diagnosis not present

## 2024-07-03 DIAGNOSIS — M25512 Pain in left shoulder: Secondary | ICD-10-CM | POA: Diagnosis not present

## 2024-07-03 DIAGNOSIS — M75102 Unspecified rotator cuff tear or rupture of left shoulder, not specified as traumatic: Secondary | ICD-10-CM | POA: Diagnosis not present

## 2024-07-05 ENCOUNTER — Other Ambulatory Visit: Payer: Self-pay

## 2024-07-05 DIAGNOSIS — M25512 Pain in left shoulder: Secondary | ICD-10-CM | POA: Diagnosis not present

## 2024-07-05 DIAGNOSIS — M75102 Unspecified rotator cuff tear or rupture of left shoulder, not specified as traumatic: Secondary | ICD-10-CM | POA: Diagnosis not present

## 2024-07-05 MED ORDER — LINACLOTIDE 72 MCG PO CAPS: 72.0000 ug | ORAL_CAPSULE | Freq: Every day | ORAL | 1 refills | Status: AC

## 2024-07-08 DIAGNOSIS — M75102 Unspecified rotator cuff tear or rupture of left shoulder, not specified as traumatic: Secondary | ICD-10-CM | POA: Diagnosis not present

## 2024-07-08 DIAGNOSIS — M25512 Pain in left shoulder: Secondary | ICD-10-CM | POA: Diagnosis not present

## 2024-07-10 DIAGNOSIS — M75102 Unspecified rotator cuff tear or rupture of left shoulder, not specified as traumatic: Secondary | ICD-10-CM | POA: Diagnosis not present

## 2024-07-10 DIAGNOSIS — M25512 Pain in left shoulder: Secondary | ICD-10-CM | POA: Diagnosis not present

## 2024-07-11 ENCOUNTER — Other Ambulatory Visit: Payer: Self-pay | Admitting: *Deleted

## 2024-07-11 DIAGNOSIS — E785 Hyperlipidemia, unspecified: Secondary | ICD-10-CM

## 2024-07-12 ENCOUNTER — Other Ambulatory Visit: Payer: Self-pay

## 2024-07-12 DIAGNOSIS — M25512 Pain in left shoulder: Secondary | ICD-10-CM | POA: Diagnosis not present

## 2024-07-12 DIAGNOSIS — M75102 Unspecified rotator cuff tear or rupture of left shoulder, not specified as traumatic: Secondary | ICD-10-CM | POA: Diagnosis not present

## 2024-07-12 MED ORDER — METHOCARBAMOL 500 MG PO TABS: 500.0000 mg | ORAL_TABLET | Freq: Two times a day (BID) | ORAL | 0 refills | Status: AC

## 2024-07-15 DIAGNOSIS — M75102 Unspecified rotator cuff tear or rupture of left shoulder, not specified as traumatic: Secondary | ICD-10-CM | POA: Diagnosis not present

## 2024-07-15 DIAGNOSIS — M25512 Pain in left shoulder: Secondary | ICD-10-CM | POA: Diagnosis not present

## 2024-07-17 DIAGNOSIS — M75102 Unspecified rotator cuff tear or rupture of left shoulder, not specified as traumatic: Secondary | ICD-10-CM | POA: Diagnosis not present

## 2024-07-17 DIAGNOSIS — M25512 Pain in left shoulder: Secondary | ICD-10-CM | POA: Diagnosis not present

## 2024-07-22 DIAGNOSIS — M25512 Pain in left shoulder: Secondary | ICD-10-CM | POA: Diagnosis not present

## 2024-07-22 DIAGNOSIS — M75102 Unspecified rotator cuff tear or rupture of left shoulder, not specified as traumatic: Secondary | ICD-10-CM | POA: Diagnosis not present

## 2024-07-24 DIAGNOSIS — M25512 Pain in left shoulder: Secondary | ICD-10-CM | POA: Diagnosis not present

## 2024-07-24 DIAGNOSIS — M75102 Unspecified rotator cuff tear or rupture of left shoulder, not specified as traumatic: Secondary | ICD-10-CM | POA: Diagnosis not present

## 2024-07-25 ENCOUNTER — Other Ambulatory Visit

## 2024-07-26 DIAGNOSIS — M25512 Pain in left shoulder: Secondary | ICD-10-CM | POA: Diagnosis not present

## 2024-07-26 DIAGNOSIS — M75102 Unspecified rotator cuff tear or rupture of left shoulder, not specified as traumatic: Secondary | ICD-10-CM | POA: Diagnosis not present

## 2024-07-29 DIAGNOSIS — M25512 Pain in left shoulder: Secondary | ICD-10-CM | POA: Diagnosis not present

## 2024-07-29 DIAGNOSIS — M75102 Unspecified rotator cuff tear or rupture of left shoulder, not specified as traumatic: Secondary | ICD-10-CM | POA: Diagnosis not present

## 2024-07-31 DIAGNOSIS — M25512 Pain in left shoulder: Secondary | ICD-10-CM | POA: Diagnosis not present

## 2024-07-31 DIAGNOSIS — M75102 Unspecified rotator cuff tear or rupture of left shoulder, not specified as traumatic: Secondary | ICD-10-CM | POA: Diagnosis not present

## 2024-08-05 DIAGNOSIS — M25512 Pain in left shoulder: Secondary | ICD-10-CM | POA: Diagnosis not present

## 2024-08-05 DIAGNOSIS — M75102 Unspecified rotator cuff tear or rupture of left shoulder, not specified as traumatic: Secondary | ICD-10-CM | POA: Diagnosis not present

## 2024-08-07 DIAGNOSIS — M25512 Pain in left shoulder: Secondary | ICD-10-CM | POA: Diagnosis not present

## 2024-08-07 DIAGNOSIS — M75102 Unspecified rotator cuff tear or rupture of left shoulder, not specified as traumatic: Secondary | ICD-10-CM | POA: Diagnosis not present

## 2024-08-09 DIAGNOSIS — M25512 Pain in left shoulder: Secondary | ICD-10-CM | POA: Diagnosis not present

## 2024-08-09 DIAGNOSIS — M75102 Unspecified rotator cuff tear or rupture of left shoulder, not specified as traumatic: Secondary | ICD-10-CM | POA: Diagnosis not present

## 2024-08-14 DIAGNOSIS — M75102 Unspecified rotator cuff tear or rupture of left shoulder, not specified as traumatic: Secondary | ICD-10-CM | POA: Diagnosis not present

## 2024-08-14 DIAGNOSIS — M25512 Pain in left shoulder: Secondary | ICD-10-CM | POA: Diagnosis not present

## 2024-08-16 DIAGNOSIS — M25512 Pain in left shoulder: Secondary | ICD-10-CM | POA: Diagnosis not present

## 2024-08-16 DIAGNOSIS — M75102 Unspecified rotator cuff tear or rupture of left shoulder, not specified as traumatic: Secondary | ICD-10-CM | POA: Diagnosis not present

## 2024-08-19 DIAGNOSIS — M25512 Pain in left shoulder: Secondary | ICD-10-CM | POA: Diagnosis not present

## 2024-08-19 DIAGNOSIS — M75102 Unspecified rotator cuff tear or rupture of left shoulder, not specified as traumatic: Secondary | ICD-10-CM | POA: Diagnosis not present

## 2024-08-20 ENCOUNTER — Other Ambulatory Visit: Payer: Self-pay

## 2024-08-20 MED ORDER — CYCLOBENZAPRINE HCL 5 MG PO TABS: 5.0000 mg | ORAL_TABLET | Freq: Every day | ORAL | 3 refills | Status: AC

## 2024-08-21 DIAGNOSIS — M75102 Unspecified rotator cuff tear or rupture of left shoulder, not specified as traumatic: Secondary | ICD-10-CM | POA: Diagnosis not present

## 2024-08-21 DIAGNOSIS — M25512 Pain in left shoulder: Secondary | ICD-10-CM | POA: Diagnosis not present

## 2024-08-22 ENCOUNTER — Other Ambulatory Visit

## 2024-08-23 DIAGNOSIS — M75102 Unspecified rotator cuff tear or rupture of left shoulder, not specified as traumatic: Secondary | ICD-10-CM | POA: Diagnosis not present

## 2024-08-23 DIAGNOSIS — M25512 Pain in left shoulder: Secondary | ICD-10-CM | POA: Diagnosis not present

## 2024-09-04 DIAGNOSIS — M25512 Pain in left shoulder: Secondary | ICD-10-CM | POA: Diagnosis not present

## 2024-09-04 DIAGNOSIS — M75102 Unspecified rotator cuff tear or rupture of left shoulder, not specified as traumatic: Secondary | ICD-10-CM | POA: Diagnosis not present

## 2024-09-06 DIAGNOSIS — M75102 Unspecified rotator cuff tear or rupture of left shoulder, not specified as traumatic: Secondary | ICD-10-CM | POA: Diagnosis not present

## 2024-09-06 DIAGNOSIS — M25512 Pain in left shoulder: Secondary | ICD-10-CM | POA: Diagnosis not present

## 2024-09-11 DIAGNOSIS — M25512 Pain in left shoulder: Secondary | ICD-10-CM | POA: Diagnosis not present

## 2024-09-11 DIAGNOSIS — M75102 Unspecified rotator cuff tear or rupture of left shoulder, not specified as traumatic: Secondary | ICD-10-CM | POA: Diagnosis not present

## 2024-09-12 DIAGNOSIS — H2513 Age-related nuclear cataract, bilateral: Secondary | ICD-10-CM | POA: Diagnosis not present

## 2024-09-12 DIAGNOSIS — H04123 Dry eye syndrome of bilateral lacrimal glands: Secondary | ICD-10-CM | POA: Diagnosis not present

## 2024-09-13 DIAGNOSIS — M25512 Pain in left shoulder: Secondary | ICD-10-CM | POA: Diagnosis not present

## 2024-09-13 DIAGNOSIS — M75102 Unspecified rotator cuff tear or rupture of left shoulder, not specified as traumatic: Secondary | ICD-10-CM | POA: Diagnosis not present

## 2024-09-17 DIAGNOSIS — M75102 Unspecified rotator cuff tear or rupture of left shoulder, not specified as traumatic: Secondary | ICD-10-CM | POA: Diagnosis not present

## 2024-09-17 DIAGNOSIS — M25512 Pain in left shoulder: Secondary | ICD-10-CM | POA: Diagnosis not present

## 2024-10-22 ENCOUNTER — Other Ambulatory Visit

## 2024-11-08 ENCOUNTER — Ambulatory Visit: Admitting: Nurse Practitioner

## 2024-12-03 ENCOUNTER — Other Ambulatory Visit: Payer: Self-pay

## 2024-12-03 ENCOUNTER — Telehealth: Payer: Self-pay

## 2024-12-03 MED ORDER — CELECOXIB 100 MG PO CAPS: 100.0000 mg | ORAL_CAPSULE | Freq: Two times a day (BID) | ORAL | 2 refills | Status: AC

## 2024-12-03 NOTE — Telephone Encounter (Signed)
 As per alyssa sent celebrex 

## 2024-12-19 ENCOUNTER — Other Ambulatory Visit: Payer: Self-pay | Admitting: Nurse Practitioner

## 2024-12-24 ENCOUNTER — Other Ambulatory Visit: Payer: Self-pay

## 2024-12-24 MED ORDER — DOXYCYCLINE HYCLATE 20 MG PO TABS: 20.0000 mg | ORAL_TABLET | Freq: Every day | ORAL | 2 refills | Status: AC

## 2024-12-26 ENCOUNTER — Other Ambulatory Visit: Payer: Self-pay | Admitting: Nurse Practitioner

## 2024-12-26 DIAGNOSIS — Z1231 Encounter for screening mammogram for malignant neoplasm of breast: Secondary | ICD-10-CM

## 2024-12-26 DIAGNOSIS — I83811 Varicose veins of right lower extremities with pain: Secondary | ICD-10-CM

## 2024-12-26 NOTE — Progress Notes (Signed)
 Mammo ordered and vascular referral

## 2025-01-14 ENCOUNTER — Other Ambulatory Visit: Payer: Self-pay | Admitting: Nurse Practitioner

## 2025-01-14 DIAGNOSIS — M2550 Pain in unspecified joint: Secondary | ICD-10-CM

## 2025-01-14 DIAGNOSIS — R5383 Other fatigue: Secondary | ICD-10-CM

## 2025-01-29 ENCOUNTER — Encounter

## 2025-01-29 ENCOUNTER — Other Ambulatory Visit
